# Patient Record
Sex: Female | Born: 1965
Health system: Southern US, Community
[De-identification: ages and names within clinical notes are randomized; demographics above are authoritative.]

## PROBLEM LIST (undated history)

## (undated) DIAGNOSIS — I82409 Acute embolism and thrombosis of unspecified deep veins of unspecified lower extremity: Secondary | ICD-10-CM

## (undated) DIAGNOSIS — I10 Essential (primary) hypertension: Secondary | ICD-10-CM

## (undated) HISTORY — DX: Essential (primary) hypertension: I10

## (undated) HISTORY — PX: TUBAL LIGATION: SHX77

---

## 2014-11-18 DIAGNOSIS — I82409 Acute embolism and thrombosis of unspecified deep veins of unspecified lower extremity: Secondary | ICD-10-CM

## 2014-11-18 HISTORY — DX: Acute embolism and thrombosis of unspecified deep veins of unspecified lower extremity: I82.409

## 2018-04-15 ENCOUNTER — Other Ambulatory Visit: Payer: Self-pay

## 2018-04-15 ENCOUNTER — Encounter (HOSPITAL_COMMUNITY): Payer: Self-pay

## 2018-04-15 ENCOUNTER — Emergency Department (HOSPITAL_COMMUNITY)
Admission: EM | Admit: 2018-04-15 | Discharge: 2018-04-15 | Disposition: A | Discharge status: Home / Self Care | Payer: Self-pay | Attending: Emergency Medicine | Admitting: Emergency Medicine

## 2018-04-15 ENCOUNTER — Emergency Department (HOSPITAL_BASED_OUTPATIENT_CLINIC_OR_DEPARTMENT_OTHER)
Admit: 2018-04-15 | Discharge: 2018-04-15 | Disposition: A | Discharge status: Home / Self Care | Payer: Self-pay | Attending: Emergency Medicine | Admitting: Emergency Medicine

## 2018-04-15 DIAGNOSIS — F17210 Nicotine dependence, cigarettes, uncomplicated: Secondary | ICD-10-CM | POA: Insufficient documentation

## 2018-04-15 DIAGNOSIS — I10 Essential (primary) hypertension: Secondary | ICD-10-CM | POA: Insufficient documentation

## 2018-04-15 DIAGNOSIS — I159 Secondary hypertension, unspecified: Secondary | ICD-10-CM

## 2018-04-15 DIAGNOSIS — M7989 Other specified soft tissue disorders: Secondary | ICD-10-CM

## 2018-04-15 DIAGNOSIS — R609 Edema, unspecified: Secondary | ICD-10-CM | POA: Insufficient documentation

## 2018-04-15 HISTORY — DX: Acute embolism and thrombosis of unspecified deep veins of unspecified lower extremity: I82.409

## 2018-04-15 LAB — COMPREHENSIVE METABOLIC PANEL
ALBUMIN: 3.6 g/dL (ref 3.5–5.0)
ALT: 20 U/L (ref 14–54)
AST: 36 U/L (ref 15–41)
Alkaline Phosphatase: 56 U/L (ref 38–126)
Anion gap: 9 (ref 5–15)
BUN: 13 mg/dL (ref 6–20)
CHLORIDE: 107 mmol/L (ref 101–111)
CO2: 24 mmol/L (ref 22–32)
Calcium: 8.9 mg/dL (ref 8.9–10.3)
Creatinine, Ser: 0.78 mg/dL (ref 0.44–1.00)
GFR calc Af Amer: >60 mL/min (ref >60)
GFR calc non Af Amer: >60 mL/min (ref >60)
GLUCOSE: 97 mg/dL (ref 65–99)
POTASSIUM: 3.6 mmol/L (ref 3.5–5.1)
SODIUM: 140 mmol/L (ref 135–145)
Total Bilirubin: 0.8 mg/dL (ref 0.3–1.2)
Total Protein: 8 g/dL (ref 6.5–8.1)

## 2018-04-15 LAB — CBC WITH DIFFERENTIAL/PLATELET
ABS IMMATURE GRANULOCYTES: 0 10*3/uL (ref 0.0–0.1)
Basophils Absolute: 0 10*3/uL (ref 0.0–0.1)
Basophils Relative: 1 %
EOS PCT: 3 %
Eosinophils Absolute: 0.2 10*3/uL (ref 0.0–0.7)
HEMATOCRIT: 43.3 % (ref 36.0–46.0)
HEMOGLOBIN: 13.7 g/dL (ref 12.0–15.0)
Immature Granulocytes: 0 %
LYMPHS ABS: 2.3 10*3/uL (ref 0.7–4.0)
Lymphocytes Relative: 42 %
MCH: 27.9 pg (ref 26.0–34.0)
MCHC: 31.6 g/dL (ref 30.0–36.0)
MCV: 88.2 fL (ref 78.0–100.0)
MONO ABS: 0.4 10*3/uL (ref 0.1–1.0)
Monocytes Relative: 8 %
NEUTROS ABS: 2.6 10*3/uL (ref 1.7–7.7)
Neutrophils Relative %: 46 %
Platelets: 192 10*3/uL (ref 150–400)
RBC: 4.91 MIL/uL (ref 3.87–5.11)
RDW: 14.4 % (ref 11.5–15.5)
WBC: 5.6 10*3/uL (ref 4.0–10.5)

## 2018-04-15 MED ORDER — HYDRALAZINE HCL 20 MG/ML IJ SOLN: 10.0000 mg | Freq: Once | INTRAMUSCULAR | Status: DC

## 2018-04-15 MED ORDER — HYDRALAZINE HCL 10 MG PO TABS
10.0000 mg | ORAL_TABLET | Freq: Once | ORAL | Status: AC
  Administered 2018-04-15: 10 mg via ORAL
  Filled 2018-04-15: qty 1

## 2018-04-15 MED ORDER — HYDROCHLOROTHIAZIDE 25 MG PO TABS: 25.0000 mg | ORAL_TABLET | Freq: Every day | ORAL | 0 refills | Status: DC

## 2018-04-15 NOTE — ED Notes (Signed)
Called for vitals re-check at 12:37; no response x2 at this time.

## 2018-04-15 NOTE — Discharge Instructions (Signed)
Your blood pressure has been persistently elevated in the ER.  Recommending that you begin medications for hypertension.  Take 25 mg hydrochlorothiazide daily.   You need to follow up with a primary care doctor in 1 week for re-check of blood pressure, there may need to be more medication changes if blood pressure is not well controlled.   Return to the ER for headache, vision changes, vomiting, chest pain, shortness of breath, severe abdominal or back pain, blood in your urine, one sided numbness or weakness.

## 2018-04-15 NOTE — Progress Notes (Signed)
*  Preliminary Results* Left lower extremity venous duplex completed. Left lower extremity is negative for deep vein thrombosis. There is no evidence of left Baker's cyst.  04/15/2018 12:39 PM  Gertie Fey, BS, RVT, RDCS, RDMS

## 2018-04-15 NOTE — ED Notes (Signed)
Pt. Stated, I ve not been call and I think they called me when I went outside and I had to go outside to get some fresh air.  I need to be put back on the list to be seen and Ive developed a toothache

## 2018-04-15 NOTE — ED Notes (Signed)
Patient verbalizes understanding of discharge instructions. Opportunity for questioning and answers were provided. Armband removed by staff, pt discharged from ED. E signature not avaiable.  

## 2018-04-15 NOTE — ED Notes (Signed)
Called name in waiting room no answer x 3.

## 2018-04-15 NOTE — ED Provider Notes (Addendum)
Signs of bilateral leg swelling left greater than right for the past 2 weeks. She denies pain denies shortness of breath. Swelling is worse at the end of the day and improved after elevating her legs. On exam alert no distress HCT exam no facial asymmetry neck supple no bruit heart bradycardic. Pulse counted at 56 bpm by me. Lungs clear auscultation abdomen nondistended nontender left lower extremity with 2+ pretibial pitting edema. Right lower extremities with 1+ pretibial pitting edema. DP pulse 2+ bilaterally.. Patient continues to smoke. She states she's cut from 3 packs per day to a cigarette per day. I counseled patient for 5 minutes on smoking cessation   Doug Sou, MD 04/15/18 1805    Doug Sou, MD 04/15/18 1806 ED ECG REPORT   Date: 04/15/2018  Rate: 55  Rhythm: sinus bradycardia  QRS Axis: normal  Intervals: normal  ST/T Wave abnormalities: normal  Conduction Disutrbances:none  Narrative Interpretation:   Old EKG Reviewed: none available  I have personally reviewed the EKG tracing and agree with the computerized printout as noted.   Doug Sou, MD 04/15/18 Rickey Primus

## 2018-04-15 NOTE — ED Provider Notes (Signed)
[facility] EMERGENCY DEPARTMENT Provider Note   CSN: [id number] Arrival date & time: 04/15/18  6962     History   Chief Complaint Chief Complaint  Patient presents with  . Leg Swelling    HPI Cheryl Mckinney is a 52 y.o. female w/ h/o L DVT here for evaluation of bilateral LE edema x 2-3 weeks, L > R.  Associated with heaviness, dull achy pain and hyperpigmented skin at ankles.  Edema worse at the end of the day, better in the morning with elevation. Has noticed BP has been high for "a long time". Denies fevers, chills, redness, distal paresthesias or numbness. H/o DVT treated with anticoagulation for one year. Denies estrogen use, recent prolonged travel, surgery, malignancy. No CP, SOB, cough, back pain, abdominal pain, headache, vision changes, hematuria.   HPI  Past Medical History:  Diagnosis Date  . DVT (deep venous thrombosis) (HCC) 2016    There are no active problems to display for this patient.   ** The histories are not reviewed yet. Please review them in the "History" navigator section and refresh this SmartLink.   OB History   None      Home Medications    Prior to Admission medications   Medication Sig Start Date End Date Taking? Authorizing Provider  hydrochlorothiazide (HYDRODIURIL) 25 MG tablet Take 1 tablet (25 mg total) by mouth daily. 04/15/18   Liberty Handy, PA-C    Family History No family history on file.  Social History Social History   Tobacco Use  . Smoking status: Current Every Day Smoker    Packs/day: 1.00    Types: Cigarettes  . Smokeless tobacco: Never Used  Substance Use Topics  . Alcohol use: Yes  . Drug use: Yes    Types: Marijuana     Allergies   Patient has no known allergies.   Review of Systems Review of Systems  Cardiovascular: Positive for leg swelling.  All other systems reviewed and are negative.    Physical Exam Updated Vital Signs BP (!) 183/93   Pulse (!) 47   Temp 98 F  (36.7 C) (Oral)   Resp 16   Ht  (1.803 m)   Wt 108 kg (238 lb)   LMP 03/16/2018   SpO2 100%   BMI 33.19 kg/m   Physical Exam  Constitutional: She appears well-developed and well-nourished.  NAD. Non toxic.   HENT:  Head: Normocephalic and atraumatic.  Nose: Nose normal.  Moist mucous membranes. Tonsils and oropharynx normal  Eyes: Conjunctivae, EOM and lids are normal.  Neck: Trachea normal and normal range of motion.  Trachea midline. No cervical adenopathy  Cardiovascular: Normal rate, regular rhythm, S1 normal, S2 normal and normal heart sounds.  Pulses:      Carotid pulses are 2+ on the right side, and 2+ on the left side.      Radial pulses are 2+ on the right side, and 2+ on the left side.       Dorsalis pedis pulses are 2+ on the right side, and 2+ on the left side.  Trace pitting edema to RLE up to knee. 2+ pitting edema to LLE right above knee. Palpable DP/PT pulses bilaterally, slightly diminished in L. No calf tenderness.   Pulmonary/Chest: Effort normal and breath sounds normal. She has no decreased breath sounds. She has no rhonchi.  No rales, crackles. Lungs CTAB.   Abdominal: Soft. Bowel sounds are normal. There is no tenderness.  No epigastric tenderness. NTND  Neurological: She is alert. GCS eye subscore is 4. GCS verbal subscore is 5. GCS motor subscore is 6.  Skin: Skin is warm and dry. Capillary refill takes less than 2 seconds.  Slight hyperpigmentation to medial left ankle. No erythema, warmth, tenderness to LLE.   Psychiatric: She has a normal mood and affect. Her speech is normal and behavior is normal. Judgment and thought content normal. Cognition and memory are normal.     ED Treatments / Results  Labs (all labs ordered are listed, but only abnormal results are displayed) Labs Reviewed  COMPREHENSIVE METABOLIC PANEL  CBC WITH DIFFERENTIAL/PLATELET    EKG None   Date: 04/15/2018  Rate: 55  Rhythm: sinus bradycardia  QRS Axis: normal   Intervals: normal  ST/T Wave abnormalities: normal  Conduction Disutrbances:none  Narrative Interpretation:   Old EKG Reviewed: none available  I have personally reviewed the EKG tracing and agree with the computerized printout as noted.   Doug Sou, MD 04/15/18 1822   Radiology No results found.  Procedures Procedures (including critical care time)  Medications Ordered in ED Medications  hydrALAZINE (APRESOLINE) tablet 10 mg (has no administration in time range)     Initial Impression / Assessment and Plan / ED Course  I have reviewed the triage vital signs and the nursing notes.  Pertinent labs & imaging results that were available during my care of the patient were reviewed by me and considered in my medical decision making (see chart for details).    Venous stasis high on differential. No DVT on [place]. No signs of cellulitis. She denies symptoms of hypertensive crisis end organ dysfunction. She has no CP, SOB, tachycardia, tachypnea to suggest PE. Creatinine WNL. EKG shows sinus bradycardia, asymptomatic.  SBP has improved in ER. Will give oral antihypertensive and discharge home with HCTZ. Stressed importance of establish care with PCP and re-evaluation in 1 week.  [facility] information. Counseled on tobacco cessation for >5 min. Pt shared with SP.   Final Clinical Impressions(s) / ED Diagnoses   Final diagnoses:  Peripheral edema  Secondary hypertension    ED Discharge Orders        Ordered    hydrochlorothiazide (HYDRODIURIL) 25 MG tablet  Daily     04/15/18 1909       Liberty Handy, PA-C 04/15/18 1916    Doug Sou, MD 04/15/18 [phone]

## 2018-04-15 NOTE — ED Notes (Signed)
Results reviewed, no changes in acuity at this time 

## 2018-04-15 NOTE — ED Triage Notes (Signed)
Pt states that she was diagnosed with a blood clot in her left leg in 2016 and was on blood thinners for a year. Pt states she is no longer on blood thinners. Pt reports that she noticed her leg swelling again two weeks ago. Denies CP, Denies SOB. Pt has palpable pulse in left foot with 2+ edema in left ankle.

## 2018-05-18 ENCOUNTER — Other Ambulatory Visit: Payer: Self-pay

## 2018-05-18 ENCOUNTER — Emergency Department (HOSPITAL_COMMUNITY): Payer: Self-pay

## 2018-05-18 ENCOUNTER — Encounter (HOSPITAL_COMMUNITY): Payer: Self-pay | Admitting: Emergency Medicine

## 2018-05-18 ENCOUNTER — Emergency Department (HOSPITAL_COMMUNITY)
Admission: EM | Admit: 2018-05-18 | Discharge: 2018-05-18 | Disposition: A | Discharge status: Home / Self Care | Payer: Self-pay | Attending: Emergency Medicine | Admitting: Emergency Medicine

## 2018-05-18 DIAGNOSIS — F17210 Nicotine dependence, cigarettes, uncomplicated: Secondary | ICD-10-CM | POA: Insufficient documentation

## 2018-05-18 DIAGNOSIS — L88 Pyoderma gangrenosum: Secondary | ICD-10-CM | POA: Insufficient documentation

## 2018-05-18 DIAGNOSIS — Z79899 Other long term (current) drug therapy: Secondary | ICD-10-CM | POA: Insufficient documentation

## 2018-05-18 LAB — CBC
HEMATOCRIT: 44.5 % (ref 36.0–46.0)
Hemoglobin: 13.9 g/dL (ref 12.0–15.0)
MCH: 27.7 pg (ref 26.0–34.0)
MCHC: 31.2 g/dL (ref 30.0–36.0)
MCV: 88.8 fL (ref 78.0–100.0)
Platelets: 198 10*3/uL (ref 150–400)
RBC: 5.01 MIL/uL (ref 3.87–5.11)
RDW: 13.9 % (ref 11.5–15.5)
WBC: 5.7 10*3/uL (ref 4.0–10.5)

## 2018-05-18 LAB — COMPREHENSIVE METABOLIC PANEL
ALT: 15 U/L (ref 0–44)
ANION GAP: 8 (ref 5–15)
AST: 19 U/L (ref 15–41)
Albumin: 3.3 g/dL — ABNORMAL LOW (ref 3.5–5.0)
Alkaline Phosphatase: 62 U/L (ref 38–126)
BILIRUBIN TOTAL: 0.4 mg/dL (ref 0.3–1.2)
BUN: 16 mg/dL (ref 6–20)
CO2: 30 mmol/L (ref 22–32)
Calcium: 8.9 mg/dL (ref 8.9–10.3)
Chloride: 104 mmol/L (ref 98–111)
Creatinine, Ser: 0.75 mg/dL (ref 0.44–1.00)
GFR calc Af Amer: >60 mL/min (ref >60)
GFR calc non Af Amer: >60 mL/min (ref >60)
Glucose, Bld: 95 mg/dL (ref 70–99)
POTASSIUM: 2.9 mmol/L — AB (ref 3.5–5.1)
Sodium: 142 mmol/L (ref 135–145)
TOTAL PROTEIN: 8.3 g/dL — AB (ref 6.5–8.1)

## 2018-05-18 LAB — SEDIMENTATION RATE: SED RATE: 30 mm/h — AB (ref 0–22)

## 2018-05-18 LAB — I-STAT CG4 LACTIC ACID, ED: Lactic Acid, Venous: 0.47 mmol/L — ABNORMAL LOW (ref 0.5–1.9)

## 2018-05-18 MED ORDER — ZINC OXIDE 12.8 % EX OINT: 1.0000 "application " | TOPICAL_OINTMENT | CUTANEOUS | 0 refills | Status: DC | PRN

## 2018-05-18 MED ORDER — OXYCODONE-ACETAMINOPHEN 5-325 MG PO TABS: 1.0000 | ORAL_TABLET | Freq: Three times a day (TID) | ORAL | 0 refills | Status: DC | PRN

## 2018-05-18 MED ORDER — OXYCODONE-ACETAMINOPHEN 5-325 MG PO TABS
1.0000 | ORAL_TABLET | Freq: Once | ORAL | Status: AC
  Administered 2018-05-18: 1 via ORAL
  Filled 2018-05-18: qty 1

## 2018-05-18 MED ORDER — POTASSIUM CHLORIDE CRYS ER 20 MEQ PO TBCR
40.0000 meq | EXTENDED_RELEASE_TABLET | Freq: Once | ORAL | Status: AC
  Administered 2018-05-18: 40 meq via ORAL
  Filled 2018-05-18: qty 2

## 2018-05-18 NOTE — ED Triage Notes (Signed)
Pt. Stated, I got bit I guess by a spider on my left leg. (lateral left) . Skin is peeling back. Its been almost a month. Area looks infected and necrosed

## 2018-05-18 NOTE — ED Notes (Signed)
Discharge instructions and prescriptions discussed with Pt. Pt verbalized understanding. Pt stable and ambulatory.   

## 2018-05-18 NOTE — ED Notes (Signed)
Patient transported to X-ray 

## 2018-05-18 NOTE — Discharge Instructions (Addendum)
[facility] on July 30 at 9:10 AM.  You can also go to [facility].  At [facility] location, they have a pharmacy and also a finance assistance center.  To care for your wound at home, apply petroleum or zinc oxide ointment to help prevent further skin breakdown at the edge of the wound.  You can clean the wound with warm water and soap, apply a topical antibiotic such as bacitracin or Neosporin, and then place a gauze dressing patient symptoms that does not stick to the skin to the wound.  You should follow-up with dermatology.  One clinic that is affordable is here: [facility] Address  [address] [place], [place] [address]  Appointments [phone]   Return to the emergency department if you develop fever, chills, red streaking up the leg, or if the area starts to have  thick, mucus-like drainage.

## 2018-05-18 NOTE — ED Provider Notes (Signed)
[facility]ENC[facility]PART[facility] Provider Note   CSN: [id number] Arrival date & time: 05/18/18  [id number]     History   Chief Complaint Chief Complaint  Patient presents with  . Insect Bite  . Leg Pain    HPI Cheryl Mckinney is a 52 y.o. female with a h/o of DVT who present to the Emergency Department with a chief complaint of left lower leg lesion.  Patient reports she notices some itching to the left lower extremity about 3 weeks ago followed by development of a large, clear-fluid filled "blister to the leg the following day. Over the next few days, the ulcer fluid changed from clear to yellow. She reports several days later she got in the bathtub and noticed yellow discoloration to the word. A few days later, she reports that her skin began to peel off around the wound.  She has 10/10 sharp pain to the left lower leg. No fever or chills. No know chemical exposures. No numbness, weakness, erythema or warmth. She has been taking Tylenol and ibuprofen with no improvement in her symptoms. She does not have insurance and is not established with a PCP.  The history is provided by the patient. No language interpreter was used.    Past Medical History:  Diagnosis Date  . DVT (deep venous thrombosis) (HCC) 2016    There are no active problems to display for this patient.   History reviewed. No pertinent surgical history.   OB History   None      Home Medications    Prior to Admission medications   Medication Sig Start Date End Date Taking? Authorizing Provider  hydrochlorothiazide (HYDRODIURIL) 25 MG tablet Take 1 tablet (25 mg total) by mouth daily. 04/15/18   Liberty HandyGibbons, Claudia J, PA-C  oxyCODONE-acetaminophen (PERCOCET/ROXICET) 5-325 MG tablet Take 1 tablet by mouth every 8 (eight) hours as needed for severe pain. 05/18/18   Omkar Stratmann A, PA-C  Zinc Oxide (TRIPLE PASTE) 12.8 % ointment Apply 1 application topically as needed for irritation. 05/18/18   Mersadez Linden  A, PA-C    Family History No family history on file.  Social History Social History   Tobacco Use  . Smoking status: Current Every Day Smoker    Packs/day: 1.00    Types: Cigarettes  . Smokeless tobacco: Never Used  Substance Use Topics  . Alcohol use: Yes  . Drug use: Yes    Types: Marijuana     Allergies   Patient has no known allergies.   Review of Systems Review of Systems  Constitutional: Negative for activity change, chills and fever.  Respiratory: Negative for shortness of breath.   Cardiovascular: Negative for chest pain.  Gastrointestinal: Negative for abdominal pain.  Genitourinary: Negative for dysuria.  Musculoskeletal: Positive for arthralgias and myalgias. Negative for back pain, gait problem and joint swelling.  Skin: Positive for wound. Negative for color change and rash.  Allergic/Immunologic: Negative for immunocompromised state.  Neurological: Negative for headaches.  Psychiatric/Behavioral: Negative for confusion.     Physical Exam Updated Vital Signs BP 132/86 (BP Location: Right Arm)   Pulse 62   Temp 98.8 F (37.1 C) (Oral)   Resp 12   Ht 5\' 11"  (1.803 m)   Wt 108 kg (238 lb)   LMP 02/16/2018   SpO2 99%   BMI 33.19 kg/m   Physical Exam  Constitutional: No distress.  HENT:  Head: Normocephalic.  Eyes: Conjunctivae are normal.  Neck: Neck supple.  Cardiovascular: Normal rate and  regular rhythm. Exam reveals no gallop and no friction rub.  No murmur heard. Pulmonary/Chest: Effort normal. No respiratory distress.  Abdominal: Soft. She exhibits no distension.  Musculoskeletal: She exhibits tenderness. She exhibits no deformity.  Large ulcerated lesion to the lower leg. LLL is tender to palpation. Ambulatory without difficulty. See picture below.   Neurological: She is alert.  Skin: Skin is warm. No rash noted.  Psychiatric: Her behavior is normal.  Nursing note and vitals reviewed.        ED Treatments / Results   Labs (all labs ordered are listed, but only abnormal results are displayed) Labs Reviewed  COMPREHENSIVE METABOLIC PANEL - Abnormal; Notable for the following components:      Result Value   Potassium 2.9 (*)    Total Protein 8.3 (*)    Albumin 3.3 (*)    All other components within normal limits  SEDIMENTATION RATE - Abnormal; Notable for the following components:   Sed Rate 30 (*)    All other components within normal limits  I-STAT CG4 LACTIC ACID, ED - Abnormal; Notable for the following components:   Lactic Acid, Venous 0.47 (*)    All other components within normal limits  CBC    EKG None  Radiology Dg Chest 2 View  Result Date: 05/18/2018 CLINICAL DATA:  Suspected pyoderma gangrenosum. Assess for pulmonary involvement EXAM: CHEST - 2 VIEW COMPARISON:  None. FINDINGS: Linear densities at the right lung base likely reflect scarring or atelectasis. No visible nodules. Heart is normal size. No effusions or acute bony abnormality. IMPRESSION: No visible pulmonary nodules by plain film. Linear densities at the right lung base likely reflects scarring or atelectasis. Electronically Signed   By: Charlett Nose M.D.   On: 05/18/2018 14:07   Dg Tibia/fibula Left  Result Date: 05/18/2018 CLINICAL DATA:  Left leg pain after spider bite. EXAM: LEFT TIBIA AND FIBULA - 2 VIEW COMPARISON:  None. FINDINGS: There is no evidence of fracture or other focal bone lesions. Soft tissue defect is seen laterally in the lower left leg concerning for laceration or other soft tissue injury. No lytic destruction is seen to suggest osteomyelitis. No foreign body is noted. IMPRESSION: Normal left tibia and fibula. Soft tissue defect seen laterally in left lower leg concerning for laceration or other soft tissue injury. Electronically Signed   By: Lupita Raider, M.D.   On: 05/18/2018 12:24    Procedures Procedures (including critical care time)  Medications Ordered in ED Medications   oxyCODONE-acetaminophen (PERCOCET/ROXICET) 5-325 MG per tablet 1 tablet (1 tablet Oral Given 05/18/18 1247)  potassium chloride SA (K-DUR,KLOR-CON) CR tablet 40 mEq (40 mEq Oral Given 05/18/18 1511)     Initial Impression / Assessment and Plan / ED Course  I have reviewed the triage vital signs and the nursing notes.  Pertinent labs & imaging results that were available during my care of the patient were reviewed by me and considered in my medical decision making (see chart for details).     52 year old female ith a h/o of DVT who present to the Emergency Department with a chief complaint of left lower leg lesion for 3 weeks. No constitutional symptoms. Lesion looks consistent with a pyoderma gangrenosa. No signs of infection. She is hypokalemic, replenished in the ED. Labs are otherwise reassuring. CXR is clear. Wound care provided in the ED. Recommended barrier cream to help with skin breakdown. Referral to dermatology has has been given. Case management consulted as the patient does not  have medical insurance at this time. Strict return precautions given. NAD. She is safe for d/c to home with outpatient f/u at this time.   Final Clinical Impressions(s) / ED Diagnoses   Final diagnoses:  Pyoderma gangrenosa    ED Discharge Orders        Ordered    Zinc Oxide (TRIPLE PASTE) 12.8 % ointment  As needed     05/18/18 1532    oxyCODONE-acetaminophen (PERCOCET/ROXICET) 5-325 MG tablet  Every 8 hours PRN     05/18/18 1532       Jasenia Weilbacher A, PA-C 05/19/18 0011    Blane Ohara, MD 05/19/18 1133    Blane Ohara, MD 05/19/18 1149

## 2018-05-19 NOTE — Discharge Planning (Signed)
Late Entry:  EDCM [facility] and placed information on AVS.

## 2018-05-26 ENCOUNTER — Emergency Department (HOSPITAL_COMMUNITY)
Admission: EM | Admit: 2018-05-26 | Discharge: 2018-05-26 | Disposition: A | Discharge status: Home / Self Care | Payer: Self-pay | Attending: Emergency Medicine | Admitting: Emergency Medicine

## 2018-05-26 ENCOUNTER — Encounter (HOSPITAL_COMMUNITY): Payer: Self-pay | Admitting: Emergency Medicine

## 2018-05-26 ENCOUNTER — Other Ambulatory Visit: Payer: Self-pay

## 2018-05-26 DIAGNOSIS — T148XXA Other injury of unspecified body region, initial encounter: Secondary | ICD-10-CM

## 2018-05-26 DIAGNOSIS — M79662 Pain in left lower leg: Secondary | ICD-10-CM | POA: Insufficient documentation

## 2018-05-26 DIAGNOSIS — Z79899 Other long term (current) drug therapy: Secondary | ICD-10-CM | POA: Insufficient documentation

## 2018-05-26 DIAGNOSIS — L089 Local infection of the skin and subcutaneous tissue, unspecified: Secondary | ICD-10-CM | POA: Insufficient documentation

## 2018-05-26 DIAGNOSIS — F17210 Nicotine dependence, cigarettes, uncomplicated: Secondary | ICD-10-CM | POA: Insufficient documentation

## 2018-05-26 MED ORDER — CLINDAMYCIN HCL 150 MG PO CAPS
300.0000 mg | ORAL_CAPSULE | Freq: Once | ORAL | Status: AC
  Administered 2018-05-26: 300 mg via ORAL
  Filled 2018-05-26: qty 2

## 2018-05-26 MED ORDER — CLINDAMYCIN HCL 300 MG PO CAPS: 300.0000 mg | ORAL_CAPSULE | Freq: Four times a day (QID) | ORAL | 0 refills | Status: AC

## 2018-05-26 NOTE — ED Notes (Signed)
Patient verbalizes understanding of discharge instructions. Opportunity for questioning and answers were provided. Armband removed by staff, pt discharged from ED ambulatory.   

## 2018-05-26 NOTE — ED Triage Notes (Signed)
Pt. Stated, I was here on July 4th for the same. I have pain meds. She said it was not infected and now it stinks of odor so I know its infected.  Left lateral lower leg. Exudate, pus, skin necrosis all around the wound

## 2018-05-26 NOTE — Discharge Instructions (Signed)
Clean wound with mild soap such as Dove. Apply the triple antibiotic ointment previously recommended.  DO NOT USE PEROXIDE on the wound. Take Clindamycin as prescribed and complete the full course. Follow up with the appointment you have, return to the ER for worsening or concerning symptoms.

## 2018-05-26 NOTE — ED Notes (Signed)
Pt's name called for triage x3.  No response.

## 2018-05-26 NOTE — ED Provider Notes (Signed)
[facility] EMERGENCY DEPARTMENT Provider Note   CSN: [id number] Arrival date & time: 05/26/18  0913     History   Chief Complaint Chief Complaint  Patient presents with  . Wound Infection    HPI Cheryl Mckinney is a 52 y.o. female.  52 year old female presents with ongoing wound to the left lower leg laterally.  Patient states that she was seen in the emergency room at the end of May for pain in her left lower leg, concerned that she may have another DVT.  Doppler study done at that time was negative for DVT.  Patient states that a few days later she noticed a small bump pop up on her left lower leg which she squeezed and states that when it opened there were 2 small dots in the area that she thought may be a spider bite.  Patient states that she has good cold her symptoms and believes she was bitten by a hobo spider.  Patient states the area right developed into a larger wound to her left lower leg which she has been cleaning with peroxide and applying Neosporin.  Patient was seen here on July 1 for the same, advised to apply triple antibiotic ointment to the area, referred to dermatology at [facility] for follow-up and is scheduled to be seen at the end of the month.  Patient states she continues to have yellow cloudy drainage from the wound and she is concerned it is infected, upset she was not given oral antibiotics at her last visit.  Denies history of diabetes, takes HCTZ for high blood pressure and leg swelling.  No other complaints or concerns.     Past Medical History:  Diagnosis Date  . DVT (deep venous thrombosis) (HCC) 2016    There are no active problems to display for this patient.   History reviewed. No pertinent surgical history.   OB History   None      Home Medications    Prior to Admission medications   Medication Sig Start Date End Date Taking? Authorizing Provider  clindamycin (CLEOCIN) 300 MG capsule Take 1 capsule (300 mg total) by mouth  every 6 (six) hours for 10 days. 05/26/18 06/05/18  Jeannie Fend, PA-C  hydrochlorothiazide (HYDRODIURIL) 25 MG tablet Take 1 tablet (25 mg total) by mouth daily. 04/15/18   Liberty Handy, PA-C  oxyCODONE-acetaminophen (PERCOCET/ROXICET) 5-325 MG tablet Take 1 tablet by mouth every 8 (eight) hours as needed for severe pain. 05/18/18   McDonald, Mia A, PA-C  Zinc Oxide (TRIPLE PASTE) 12.8 % ointment Apply 1 application topically as needed for irritation. 05/18/18   McDonald, Mia A, PA-C    Family History No family history on file.  Social History Social History   Tobacco Use  . Smoking status: Current Every Day Smoker    Packs/day: 1.00    Types: Cigarettes  . Smokeless tobacco: Never Used  Substance Use Topics  . Alcohol use: Yes  . Drug use: Yes    Types: Marijuana     Allergies   Patient has no known allergies.   Review of Systems Review of Systems  Constitutional: Negative for chills and fever.  Gastrointestinal: Negative for vomiting.  Musculoskeletal: Negative for arthralgias and myalgias.  Skin: Positive for wound.  Allergic/Immunologic: Negative for immunocompromised state.  Hematological: Negative for adenopathy. Does not bruise/bleed easily.  Psychiatric/Behavioral: Negative for confusion.  All other systems reviewed and are negative.    Physical Exam Updated Vital Signs BP (!) 141/97  Pulse 70   Temp 98.3 F (36.8 C) (Oral)   Resp 18   Ht 5\' 11"  (1.803 m)   Wt 108 kg (238 lb)   LMP 03/17/2018   SpO2 100%   BMI 33.19 kg/m   Physical Exam  Constitutional: She is oriented to person, place, and time. She appears well-developed and well-nourished. No distress.  HENT:  Head: Normocephalic and atraumatic.  Pulmonary/Chest: Effort normal.  Musculoskeletal: She exhibits no deformity.  Neurological: She is alert and oriented to person, place, and time.  Skin: Skin is warm and dry. Capillary refill takes less than 2 seconds. She is not diaphoretic. No  erythema.     Psychiatric: She has a normal mood and affect. Her behavior is normal.  Nursing note and vitals reviewed.    ED Treatments / Results  Labs (all labs ordered are listed, but only abnormal results are displayed) Labs Reviewed - No data to display  EKG None  Radiology No results found.  Procedures Procedures (including critical care time)  Medications Ordered in ED Medications  clindamycin (CLEOCIN) capsule 300 mg (300 mg Oral Given 05/26/18 1341)     Initial Impression / Assessment and Plan / ED Course  I have reviewed the triage vital signs and the nursing notes.  Pertinent labs & imaging results that were available during my care of the patient were reviewed by me and considered in my medical decision making (see chart for details).  Clinical Course as of May 27 1347  Tue May 26, 2018  1343 52yo female here for wound recheck, seen 8 days ago for same, referred to dermatology but concerned she needs an antibiotic for her leg wound/infection due to drainage described as yellow/milky/with odor. On exam, wound appears to be slowly improving compared to previous image, wound does have a small amount of purulent drainage which has been cultured. Advised him to stop using peroxide to clean her wound.  Recommend mild soap such as Dove, she can apply the antibiotic ointment previously prescribed, take clindamycin as prescribed and follow-up with dermatology as previously scheduled.  Image updated for patient's records today, discussed with Dr. Erma HeritageIsaacs who agrees with plan of care, differential considered includes pyoderma.   [LM]    Clinical Course User Index [LM] Jeannie FendMurphy, Kynzley Dowson A, PA-C   Final Clinical Impressions(s) / ED Diagnoses   Final diagnoses:  Wound infection    ED Discharge Orders        Ordered    clindamycin (CLEOCIN) 300 MG capsule  Every 6 hours     05/26/18 1323       Jeannie FendMurphy, Sharmin Foulk A, PA-C 05/26/18 1348    Shaune PollackIsaacs, Cameron, MD 05/27/18  [phone]

## 2018-06-16 ENCOUNTER — Ambulatory Visit (INDEPENDENT_AMBULATORY_CARE_PROVIDER_SITE_OTHER): Payer: Self-pay | Admitting: Physician Assistant

## 2018-07-17 ENCOUNTER — Ambulatory Visit: Payer: Self-pay | Attending: Nurse Practitioner | Admitting: Nurse Practitioner

## 2018-07-17 ENCOUNTER — Ambulatory Visit: Payer: Self-pay | Admitting: Family Medicine

## 2018-07-17 ENCOUNTER — Encounter: Payer: Self-pay | Admitting: Nurse Practitioner

## 2018-07-17 VITALS — BP 177/122 | HR 63 | Temp 99.1°F | Ht 71.5 in | Wt 219.2 lb

## 2018-07-17 DIAGNOSIS — Z86718 Personal history of other venous thrombosis and embolism: Secondary | ICD-10-CM | POA: Insufficient documentation

## 2018-07-17 DIAGNOSIS — Z8249 Family history of ischemic heart disease and other diseases of the circulatory system: Secondary | ICD-10-CM | POA: Insufficient documentation

## 2018-07-17 DIAGNOSIS — F17200 Nicotine dependence, unspecified, uncomplicated: Secondary | ICD-10-CM | POA: Insufficient documentation

## 2018-07-17 DIAGNOSIS — L080 Pyoderma: Secondary | ICD-10-CM | POA: Insufficient documentation

## 2018-07-17 DIAGNOSIS — Z79899 Other long term (current) drug therapy: Secondary | ICD-10-CM | POA: Insufficient documentation

## 2018-07-17 DIAGNOSIS — I10 Essential (primary) hypertension: Secondary | ICD-10-CM | POA: Insufficient documentation

## 2018-07-17 DIAGNOSIS — Z833 Family history of diabetes mellitus: Secondary | ICD-10-CM | POA: Insufficient documentation

## 2018-07-17 DIAGNOSIS — L88 Pyoderma gangrenosum: Secondary | ICD-10-CM

## 2018-07-17 MED ORDER — ZINC OXIDE 12.8 % EX OINT: 1.0000 "application " | TOPICAL_OINTMENT | CUTANEOUS | 1 refills | Status: DC | PRN

## 2018-07-17 MED ORDER — PREDNISONE 20 MG PO TABS: 60.0000 mg | ORAL_TABLET | Freq: Every day | ORAL | 1 refills | Status: AC

## 2018-07-17 MED ORDER — CLONIDINE HCL 0.1 MG PO TABS
0.2000 mg | ORAL_TABLET | Freq: Once | ORAL | Status: AC
Start: 2018-07-17 — End: 2018-07-17
  Administered 2018-07-17: 0.2 mg via ORAL

## 2018-07-17 MED ORDER — HYDROCHLOROTHIAZIDE 25 MG PO TABS: 25.0000 mg | ORAL_TABLET | Freq: Every day | ORAL | 0 refills | Status: DC

## 2018-07-17 MED ORDER — TRAMADOL HCL 50 MG PO TABS: 50.0000 mg | ORAL_TABLET | Freq: Four times a day (QID) | ORAL | 0 refills | Status: DC | PRN

## 2018-07-17 MED ORDER — CLINDAMYCIN HCL 300 MG PO CAPS: 300.0000 mg | ORAL_CAPSULE | Freq: Four times a day (QID) | ORAL | 0 refills | Status: AC

## 2018-07-17 MED ORDER — PREDNISONE 20 MG PO TABS: 60.0000 mg | ORAL_TABLET | Freq: Every day | ORAL | 1 refills | Status: DC

## 2018-07-17 MED FILL — predniSONE 20 MG TABS: 20 | 30 days supply | Qty: 90 | Fill #0

## 2018-07-17 MED FILL — CLINDAMYCIN HCL 300 MG CAP: 300 | 10 days supply | Qty: 40 | Fill #0

## 2018-07-17 NOTE — Progress Notes (Signed)
Assessment & Plan:  Cheryl Mckinney was seen today for new patient (initial visit) and wound check.  Diagnoses and all orders for this visit:  Pyoderma gangrenosa STOP SMOKING. This only worsens circulation.  -     clindamycin (CLEOCIN) 300 MG capsule; Take 1 capsule (300 mg total) by mouth 4 (four) times daily for 10 days. -     predniSONE (DELTASONE) 20 MG tablet; Take 3 tablets (60 mg total) by mouth daily with breakfast. -     Zinc Oxide (TRIPLE PASTE) 12.8 % ointment; Apply 1 application topically as needed for irritation. -     traMADol (ULTRAM) 50 MG tablet; Take 1 tablet (50 mg total) by mouth every 6 (six) hours as needed.  Essential hypertension -     cloNIDine (CATAPRES) tablet 0.2 mg -     hydrochlorothiazide (HYDRODIURIL) 25 MG tablet; Take 1 tablet (25 mg total) by mouth daily. -     CMP14+EGFR Continue all antihypertensives as prescribed.  Remember to bring in your blood pressure log with you for your follow up appointment.  DASH/Mediterranean Diets are healthier choices for HTN.      Patient has been counseled on age-appropriate routine health concerns for screening and prevention. These are reviewed and up-to-date. Referrals have been placed accordingly. Immunizations are up-to-date or declined.    Subjective:   Chief Complaint  Patient presents with  . New Patient (Initial Visit)    Pt. is here for hypertension. Pt. stated she only had her BP meds for one month.   . Wound Check    Pt. have a left leg wound that have not been heal. Pt. stated she used to be on Warfarin and coumadin shots before.    HPI Cheryl Mckinney 52 y.o. female presents to office today for hospital follow up.     Skin Lesion She has a history of left lower leg wound which she has been seen in the ED for twice since May 18, 2018. She believed she had been bitten by a spider.  There was concern for pyoderma gangrenosa and patient was referred to Dermatology/[facility]. She states  today she will not be able to follow up due to lack of insurance. She was given clindamycin and states the wound improved slightly however once she completed the antibiotics the wound started draining yellowish fluid and the affected area became larger with swelling and tenderness. She ruled out recently for DVT. She does endorse a PMH of LLE DVT. Today I will start her on prednisone, clindamycin and give her an antibiotic ointment to place around the lesion. She has been instructed to follow up to establish care with her PCP and Patient was urged to apply for the financial assistance program.  She was instructed to inquire at the front desk about the application process for the [place] discount, orange card or other financial assistance.     CHRONIC HYPERTENSION Disease Monitoring  Blood pressure range BP Readings from Last 3 Encounters:  07/17/18 (!) 177/122  05/26/18 (!) 141/97  05/18/18 132/86   Chest pain: no   Dyspnea: no   Claudication: no  Medication compliance: no  Medication Side Effects  Lightheadedness: no   Urinary frequency: no   Edema: yes   Impotence: no  Preventitive Healthcare:  Exercise: no   Diet Pattern: diet: general  Salt Restriction:  no   Review of Systems  Constitutional: Negative for fever, malaise/fatigue and weight loss.  HENT: Negative.  Negative for nosebleeds.  Eyes: Negative.  Negative for blurred vision, double vision and photophobia.  Respiratory: Negative.  Negative for cough and shortness of breath.   Cardiovascular: Negative.  Negative for chest pain, palpitations and leg swelling.  Gastrointestinal: Negative.  Negative for heartburn, nausea and vomiting.  Musculoskeletal: Negative.  Negative for myalgias.  Skin:       SEE HPI  Neurological: Negative.  Negative for dizziness, focal weakness, seizures and headaches.  Psychiatric/Behavioral: Negative.  Negative for suicidal ideas.    Past Medical History:  Diagnosis Date  . DVT (deep  venous thrombosis) ([place]) 2016  . Hypertension     Past Surgical History:  Procedure Laterality Date  . CESAREAN SECTION     x5  . TUBAL LIGATION      Family History  Problem Relation Age of Onset  . Hypertension Mother   . Diabetes Mother     Social History Reviewed with no changes to be made today.   Outpatient Medications Prior to Visit  Medication Sig Dispense Refill  . hydrochlorothiazide (HYDRODIURIL) 25 MG tablet Take 1 tablet (25 mg total) by mouth daily. (Patient not taking: Reported on 07/17/2018) 30 tablet 0  . oxyCODONE-acetaminophen (PERCOCET/ROXICET) 5-325 MG tablet Take 1 tablet by mouth every 8 (eight) hours as needed for severe pain. (Patient not taking: Reported on 07/17/2018) 8 tablet 0  . Zinc Oxide (TRIPLE PASTE) 12.8 % ointment Apply 1 application topically as needed for irritation. (Patient not taking: Reported on 07/17/2018) 56.7 g 0   No facility-administered medications prior to visit.     No Known Allergies     Objective:    BP (!) 177/122 (BP Location: Right Arm, Patient Position: Sitting, Cuff Size: Large)   Pulse 63   Temp 99.1 F (37.3 C) (Oral)   Ht 5' 11.5" (1.816 m)   Wt 219 lb 3.2 oz (99.4 kg)   LMP  (LMP Unknown)   SpO2 93%   BMI 30.15 kg/m  Wt Readings from Last 3 Encounters:  07/17/18 219 lb 3.2 oz (99.4 kg)  05/26/18 238 lb (108 kg)  05/18/18 238 lb (108 kg)    Physical Exam  Constitutional: She is oriented to person, place, and time. She appears well-developed and well-nourished. She is cooperative.  HENT:  Head: Normocephalic and atraumatic.  Eyes: EOM are normal.  Neck: Normal range of motion.  Cardiovascular: Normal rate, regular rhythm, normal heart sounds and intact distal pulses. Exam reveals no gallop and no friction rub.  No murmur heard. Pulmonary/Chest: Effort normal and breath sounds normal. No tachypnea. No respiratory distress. She has no decreased breath sounds. She has no wheezes. She has no rhonchi. She has  no rales. She exhibits no tenderness.  Abdominal: Soft. Bowel sounds are normal.  Musculoskeletal: Normal range of motion. She exhibits no edema.       Left lower leg: She exhibits tenderness and swelling.  Neurological: She is alert and oriented to person, place, and time. Coordination normal.  Skin: Skin is warm and dry. Lesion (See photo below) noted.  Psychiatric: She has a normal mood and affect. Her behavior is normal. Judgment and thought content normal.  Nursing note and vitals reviewed.       Patient has been counseled extensively about nutrition and exercise as well as the importance of adherence with medications and regular follow-up. The patient was given clear instructions to go to ER or return to medical center if symptoms don't improve, worsen or new problems develop. The patient verbalized understanding.  Follow-up: Return in about 10 days (around 07/27/2018) for Have her follow up with DR. FULP (NEW PCP) as initially  scheduled. Gildardo Pounds, FNP-BC [facility], [place]   07/21/2018, 5:23 PM

## 2018-07-18 LAB — CMP14+EGFR
ALK PHOS: 65 IU/L (ref 39–117)
ALT: 8 IU/L (ref 0–32)
AST: 14 IU/L (ref 0–40)
Albumin/Globulin Ratio: 1.1 — ABNORMAL LOW (ref 1.2–2.2)
Albumin: 4 g/dL (ref 3.5–5.5)
BUN/Creatinine Ratio: 12 (ref 9–23)
BUN: 13 mg/dL (ref 6–24)
Bilirubin Total: 0.3 mg/dL (ref 0.0–1.2)
CO2: 21 mmol/L (ref 20–29)
CREATININE: 1.12 mg/dL — AB (ref 0.57–1.00)
Calcium: 9 mg/dL (ref 8.7–10.2)
Chloride: 106 mmol/L (ref 96–106)
GFR calc Af Amer: 66 mL/min/{1.73_m2} (ref >59)
GFR calc non Af Amer: 57 mL/min/{1.73_m2} — ABNORMAL LOW (ref >59)
GLUCOSE: 85 mg/dL (ref 65–99)
Globulin, Total: 3.6 g/dL (ref 1.5–4.5)
Potassium: 3.6 mmol/L (ref 3.5–5.2)
Sodium: 141 mmol/L (ref 134–144)
Total Protein: 7.6 g/dL (ref 6.0–8.5)

## 2018-07-21 ENCOUNTER — Encounter: Payer: Self-pay | Admitting: Nurse Practitioner

## 2018-07-22 ENCOUNTER — Telehealth: Payer: Self-pay | Admitting: Nurse Practitioner

## 2018-07-22 ENCOUNTER — Telehealth: Payer: Self-pay

## 2018-07-22 NOTE — Telephone Encounter (Signed)
CMA attempt to call patient to inform on results.  No answer and left a VM for patient to call back, if patient call back, please inform:  Creatinine or kidney level is slightly elevated. Will continue to monitor for now. Your blood pressure medication may need to be changed based on future lab results. Will need to have your labs rechecked at your next office visit on 08-04-2018  A letter will be send out to reach patient.

## 2018-07-22 NOTE — Telephone Encounter (Signed)
Patient called back regarding a missed call regarding lab results. Please follow up with patient.

## 2018-07-22 NOTE — Telephone Encounter (Signed)
-----   Message from Claiborne Rigg, NP sent at 07/21/2018  5:37 PM EDT ----- Creatinine or kidney level is slightly elevated. Will continue to monitor for now. Your blood pressure medication may need to be changed based on future lab results. Will need to have your labs rechecked at your next office visit on 08-04-2018

## 2018-07-23 NOTE — Telephone Encounter (Signed)
CMA attempt to call patient to inform her lab results.  No answer and left a VM for a call back.

## 2018-07-29 NOTE — Telephone Encounter (Signed)
CMA attempt to call patient back to inform on results.  No answer and left a Vm.

## 2018-07-29 NOTE — Telephone Encounter (Signed)
CMA spoke to patient and inform on lab results. Patient verified DOB. Patient understood.

## 2018-07-29 NOTE — Telephone Encounter (Signed)
Patient called requesting lab results, please follow up °

## 2018-08-04 ENCOUNTER — Encounter: Payer: Self-pay | Admitting: Family Medicine

## 2018-08-04 ENCOUNTER — Other Ambulatory Visit: Payer: Self-pay

## 2018-08-04 ENCOUNTER — Ambulatory Visit: Payer: Self-pay | Attending: Family Medicine | Admitting: Family Medicine

## 2018-08-04 VITALS — BP 160/92 | HR 88 | Temp 99.2°F | Resp 18 | Ht 71.0 in | Wt 212.0 lb

## 2018-08-04 DIAGNOSIS — T148XXA Other injury of unspecified body region, initial encounter: Secondary | ICD-10-CM

## 2018-08-04 DIAGNOSIS — F17210 Nicotine dependence, cigarettes, uncomplicated: Secondary | ICD-10-CM | POA: Insufficient documentation

## 2018-08-04 DIAGNOSIS — X58XXXD Exposure to other specified factors, subsequent encounter: Secondary | ICD-10-CM | POA: Insufficient documentation

## 2018-08-04 DIAGNOSIS — Z79899 Other long term (current) drug therapy: Secondary | ICD-10-CM | POA: Insufficient documentation

## 2018-08-04 DIAGNOSIS — I10 Essential (primary) hypertension: Secondary | ICD-10-CM

## 2018-08-04 DIAGNOSIS — S81802D Unspecified open wound, left lower leg, subsequent encounter: Secondary | ICD-10-CM | POA: Insufficient documentation

## 2018-08-04 DIAGNOSIS — Z86718 Personal history of other venous thrombosis and embolism: Secondary | ICD-10-CM | POA: Insufficient documentation

## 2018-08-04 DIAGNOSIS — L089 Local infection of the skin and subcutaneous tissue, unspecified: Secondary | ICD-10-CM

## 2018-08-04 DIAGNOSIS — L88 Pyoderma gangrenosum: Secondary | ICD-10-CM

## 2018-08-04 DIAGNOSIS — Z833 Family history of diabetes mellitus: Secondary | ICD-10-CM | POA: Insufficient documentation

## 2018-08-04 DIAGNOSIS — Z8249 Family history of ischemic heart disease and other diseases of the circulatory system: Secondary | ICD-10-CM | POA: Insufficient documentation

## 2018-08-04 MED ORDER — TRAMADOL HCL 50 MG PO TABS: 50.0000 mg | ORAL_TABLET | Freq: Two times a day (BID) | ORAL | 0 refills | Status: AC | PRN

## 2018-08-04 MED ORDER — HYDROCHLOROTHIAZIDE 25 MG PO TABS: 25.0000 mg | ORAL_TABLET | Freq: Every day | ORAL | 0 refills | Status: DC

## 2018-08-04 MED ORDER — CLINDAMYCIN HCL 300 MG PO CAPS: 300.0000 mg | ORAL_CAPSULE | Freq: Three times a day (TID) | ORAL | 0 refills | Status: AC

## 2018-08-04 MED ORDER — AMLODIPINE BESYLATE 5 MG PO TABS: 5.0000 mg | ORAL_TABLET | Freq: Every day | ORAL | 6 refills | Status: DC

## 2018-08-04 MED FILL — traMADol HCL 50 MG TABS: 50 | 30 days supply | Qty: 60 | Fill #0

## 2018-08-04 MED FILL — HYDROCHLOROTHIAZIDE 25 MG T: 25 | 30 days supply | Qty: 30 | Fill #0

## 2018-08-04 MED FILL — AMLODIPINE BESYLATE 5 MG TA: 5 | 30 days supply | Qty: 30 | Fill #0

## 2018-08-04 MED FILL — CLINDAMYCIN HCL 300 MG CAP: 300 | 10 days supply | Qty: 30 | Fill #0

## 2018-08-04 NOTE — Progress Notes (Signed)
Flu:0 Pain: 0 Patient has wound on left lower leg

## 2018-08-04 NOTE — Progress Notes (Signed)
Subjective:    Patient ID: Cheryl Mckinney, female    DOB: Oct 18, 1966, 52 y.o.   MRN: [id number]  HPI 52 year old female who was seen in follow-up of recent visit here on 07/17/2018 at which time she was diagnosed with pyoderma gangrenosum.  Patient also with elevated blood pressure of 177/122 and patient was placed on hydrochlorothiazide.  Patient was treated with clindamycin, prednisone and zinc oxide paste as well as tramadol for pain.  Patient returns at today's visit for recheck of her wound and blood pressure follow-up.  Patient's blood pressure today's visit initially elevated at 160/92.      Patient states that initially she has 3 separate open areas on her left lower leg but after starting the medications prescribed at her last visit the areas all joined into one large open area. Patient states that the drainage from the area has stopped. Patient still gets occasional sharp shooting pain in the area of the wound. Pain tends to occur more so at night and awakens her from sleep and it hurts at times to have the bed sheets touch her leg. Patient feels as if she still has swelling in the area of her wound and her leg has been warm to touch in the area of the wound. Past Medical History:  Diagnosis Date  . DVT (deep venous thrombosis) (HCC) 2016  . Hypertension    Past Surgical History:  Procedure Laterality Date  . CESAREAN SECTION     x5  . TUBAL LIGATION     Family History  Problem Relation Age of Onset  . Hypertension Mother   . Diabetes Mother    Social History   Tobacco Use  . Smoking status: Current Every Day Smoker    Packs/day: 0.50    Types: Cigarettes  . Smokeless tobacco: Never Used  Substance Use Topics  . Alcohol use: Yes    Comment: once-twice weeks.   . Drug use: Yes    Types: Marijuana  No Known Allergies   Review of Systems  Constitutional: Positive for fatigue. Negative for chills and fever.  Respiratory: Negative for cough and shortness of breath.     Cardiovascular: Positive for leg swelling. Negative for chest pain and palpitations.  Gastrointestinal: Negative for abdominal pain and nausea.  Genitourinary: Negative for dysuria and frequency.  Musculoskeletal: Positive for myalgias. Negative for joint swelling.  Neurological: Negative for dizziness and headaches.       Objective:   Physical Exam BP (!) 160/92   Pulse 88   Temp 99.2 F (37.3 C) (Oral)   Resp 18   Ht 5\' 11"  (1.803 m)   Wt 212 lb (96.2 kg)   LMP  (LMP Unknown)   SpO2 95%   BMI 29.57 kg/m   Gen-WNWD female in NAD sitting on exam table with her legs extended Lungs- CTA  CV- RRR ABD-soft and non-tender Skin- patient with a large shallow open wound on the left anterio-lateral left lower leg-area is somewhat irregular and measures 2.8 inches across at widest point by 2.6 inches from top to bottom- area has granulomatous appearing "proud flesh" but also some areas of yellow exudate - there is some yellow thin drainage on the paper beneath patient's leg on exam table; surrounding skin on the left lower leg is slightly darker and has edema and is warm to touch        Assessment & Plan:  1. Infected wound Patient with an open infected wound to the left lower leg. I have  not previously seen patient for this problem but she has had multiple ED visits and by her description, the wound may have increased in size. Patient will be referred to wound care for further evaluation and treatment. Will check CBC to look for elevated WBC and will have patient restart the clindamycin that she was previously taking - AMB referral to wound care center - CBC with Differential - clindamycin (CLEOCIN) 300 MG capsule; Take 1 capsule (300 mg total) by mouth 3 (three) times daily for 10 days.  Dispense: 30 capsule; Refill: 0  2. Pyoderma gangrenosa Patient's wound per chart review was thought to be a form of pyoderma gangrenosa and patient [facility] but no longer afford care there. I could not access her derm notes from there but patient may also need ongoing derm follow-up but at this time will try to get her seen by local wound care so that area begins to heal - AMB referral to wound care center - CBC with Differential - traMADol (ULTRAM) 50 MG tablet; Take 1 tablet (50 mg total) by mouth every 12 (twelve) hours as needed.  Dispense: 60 tablet; Refill: 0 - clindamycin (CLEOCIN) 300 MG capsule; Take 1 capsule (300 mg total) by mouth 3 (three) times daily for 10 days.  Dispense: 30 capsule; Refill: 0  3. Essential hypertension BP remains uncontrolled and norvasc 5 mg will be added and can be increased if BP is still high at follow-up - Basic Metabolic Panel - amLODipine (NORVASC) 5 MG tablet; Take 1 tablet (5 mg total) by mouth daily.  Dispense: 30 tablet; Refill: 6 - hydrochlorothiazide (HYDRODIURIL) 25 MG tablet; Take 1 tablet (25 mg total) by mouth daily.  Dispense: 90 tablet; Refill: 0  An After Visit Summary was printed and given to the patient.  Return in about 1 week (around 08/11/2018).

## 2018-08-05 ENCOUNTER — Telehealth: Payer: Self-pay

## 2018-08-05 LAB — CBC WITH DIFFERENTIAL/PLATELET
Basophils Absolute: 0 x10E3/uL (ref 0.0–0.2)
Basos: 0 %
EOS (ABSOLUTE): 0 x10E3/uL (ref 0.0–0.4)
Eos: 0 %
Hematocrit: 41 % (ref 34.0–46.6)
Hemoglobin: 14.2 g/dL (ref 11.1–15.9)
Immature Grans (Abs): 0 x10E3/uL (ref 0.0–0.1)
Immature Granulocytes: 1 %
Lymphocytes Absolute: 0.9 x10E3/uL (ref 0.7–3.1)
Lymphs: 11 %
MCH: 29.3 pg (ref 26.6–33.0)
MCHC: 34.6 g/dL (ref 31.5–35.7)
MCV: 85 fL (ref 79–97)
Monocytes Absolute: 0.3 x10E3/uL (ref 0.1–0.9)
Monocytes: 4 %
Neutrophils Absolute: 7.1 x10E3/uL — ABNORMAL HIGH (ref 1.4–7.0)
Neutrophils: 84 %
Platelets: 194 x10E3/uL (ref 150–450)
RBC: 4.85 x10E6/uL (ref 3.77–5.28)
RDW: 14.2 % (ref 12.3–15.4)
WBC: 8.4 x10E3/uL (ref 3.4–10.8)

## 2018-08-05 LAB — BASIC METABOLIC PANEL WITH GFR
BUN/Creatinine Ratio: 23 (ref 9–23)
BUN: 21 mg/dL (ref 6–24)
CO2: 33 mmol/L — ABNORMAL HIGH (ref 20–29)
Calcium: 9.8 mg/dL (ref 8.7–10.2)
Chloride: 92 mmol/L — ABNORMAL LOW (ref 96–106)
Creatinine, Ser: 0.92 mg/dL (ref 0.57–1.00)
GFR calc Af Amer: 84 mL/min/1.73
GFR calc non Af Amer: 73 mL/min/1.73
Glucose: 144 mg/dL — ABNORMAL HIGH (ref 65–99)
Potassium: 3.6 mmol/L (ref 3.5–5.2)
Sodium: 140 mmol/L (ref 134–144)

## 2018-08-05 NOTE — Telephone Encounter (Signed)
Patient was called, no answer, lvm to return call. 

## 2018-08-05 NOTE — Telephone Encounter (Signed)
Patient called, verified DOB, and was given most recent lab results. Patient verbalized understanding and had no further questions.

## 2018-08-05 NOTE — Telephone Encounter (Signed)
-----   Message from Cain Saupeammie Fulp, MD sent at 08/05/2018 12:27 PM EDT ----- Notify patient that her CBC was normal but she still needs to continue antibiotic therapy and follow-up with wound care. Her BMP indicated that her blood sugar was elevated at 144. Will check a Hgb A1c at her next visit to see how her blood sugar has been over 90 days and check glucose level at next visit

## 2018-08-11 ENCOUNTER — Ambulatory Visit: Payer: Self-pay | Attending: Nurse Practitioner | Admitting: Family Medicine

## 2018-08-11 ENCOUNTER — Encounter: Payer: Self-pay | Admitting: Family Medicine

## 2018-08-11 VITALS — BP 144/73 | HR 85 | Temp 99.1°F | Resp 16 | Wt 211.0 lb

## 2018-08-11 DIAGNOSIS — T148XXA Other injury of unspecified body region, initial encounter: Secondary | ICD-10-CM

## 2018-08-11 DIAGNOSIS — X58XXXA Exposure to other specified factors, initial encounter: Secondary | ICD-10-CM | POA: Insufficient documentation

## 2018-08-11 DIAGNOSIS — F17210 Nicotine dependence, cigarettes, uncomplicated: Secondary | ICD-10-CM | POA: Insufficient documentation

## 2018-08-11 DIAGNOSIS — I10 Essential (primary) hypertension: Secondary | ICD-10-CM

## 2018-08-11 DIAGNOSIS — L089 Local infection of the skin and subcutaneous tissue, unspecified: Secondary | ICD-10-CM

## 2018-08-11 DIAGNOSIS — Z86718 Personal history of other venous thrombosis and embolism: Secondary | ICD-10-CM | POA: Insufficient documentation

## 2018-08-11 DIAGNOSIS — S81802A Unspecified open wound, left lower leg, initial encounter: Secondary | ICD-10-CM | POA: Insufficient documentation

## 2018-08-11 MED ORDER — TETANUS-DIPHTH-ACELL PERTUSSIS 5-2.5-18.5 LF-MCG/0.5 IM SUSP: 0.5000 mL | Freq: Once | INTRAMUSCULAR | 0 refills | Status: AC

## 2018-08-11 NOTE — Patient Instructions (Signed)
Td Vaccine (Tetanus and Diphtheria): What You Need to Know 1. Why get vaccinated? Tetanus  and diphtheria are very serious diseases. They are rare in the [place] today, but people who do become infected often have severe complications. Td vaccine is used to protect adolescents and adults from both of these diseases. Both tetanus and diphtheria are infections caused by bacteria. Diphtheria spreads from person to person through coughing or sneezing. Tetanus-causing bacteria enter the body through cuts, scratches, or wounds. TETANUS (lockjaw) causes painful muscle tightening and stiffness, usually all over the body.  It can lead to tightening of muscles in the head and neck so you can't open your mouth, swallow, or sometimes even breathe. Tetanus kills about 1 out of every 10 people who are infected even after receiving the best medical care.  DIPHTHERIA can cause a thick coating to form in the back of the throat.  It can lead to breathing problems, paralysis, heart failure, and death.  Before vaccines, as many as 200,000 cases of diphtheria and hundreds of cases of tetanus were reported in the [place] each year. Since vaccination began, reports of cases for both diseases have dropped by about 99%. 2. Td vaccine Td vaccine can protect adolescents and adults from tetanus and diphtheria. Td is usually given as a booster dose every 10 years but it can also be given earlier after a severe and dirty wound or burn. Another vaccine, called Tdap, which protects against pertussis in addition to tetanus and diphtheria, is sometimes recommended instead of Td vaccine. Your doctor or the person giving you the vaccine can give you more information. Td may safely be given at the same time as other vaccines. 3. Some people should not get this vaccine  A person who has ever had a life-threatening allergic reaction after a previous dose of any tetanus or diphtheria containing vaccine, OR has a severe  allergy to any part of this vaccine, should not get Td vaccine. Tell the person giving the vaccine about any severe allergies.  Talk to your doctor if you: ? had severe pain or swelling after any vaccine containing diphtheria or tetanus, ? ever had a condition called Guillain Barre Syndrome (GBS), ? aren't feeling well on the day the shot is scheduled. 4. What are the risks from Td vaccine? With any medicine, including vaccines, there is a chance of side effects. These are usually mild and go away on their own. Serious reactions are also possible but are rare. Most people who get Td vaccine do not have any problems with it. Mild problems following Td vaccine: (Did not interfere with activities)  Pain where the shot was given (about 8 people in 10)  Redness or swelling where the shot was given (about 1 person in 4)  Mild fever (rare)  Headache (about 1 person in 4)  Tiredness (about 1 person in 4)  Moderate problems following Td vaccine: (Interfered with activities, but did not require medical attention)  Fever over 102F (rare)  Severe problems following Td vaccine: (Unable to perform usual activities; required medical attention)  Swelling, severe pain, bleeding and/or redness in the arm where the shot was given (rare).  Problems that could happen after any vaccine:  People sometimes faint after a medical procedure, including vaccination. Sitting or lying down for about 15 minutes can help prevent fainting, and injuries caused by a fall. Tell your doctor if you feel dizzy, or have vision changes or ringing in the ears.  Some people get   severe pain in the shoulder and have difficulty moving the arm where a shot was given. This happens very rarely.  Any medication can cause a severe allergic reaction. Such reactions from a vaccine are very rare, estimated at fewer than 1 in a million doses, and would happen within a few minutes to a few hours after the vaccination. As with any  medicine, there is a very remote chance of a vaccine causing a serious injury or death. The safety of vaccines is always being monitored. For more information, visit: www.cdc.gov/vaccinesafety/ 5. What if there is a serious reaction? What should I look for? Look for anything that concerns you, such as signs of a severe allergic reaction, very high fever, or unusual behavior. Signs of a severe allergic reaction can include hives, swelling of the face and throat, difficulty breathing, a fast heartbeat, dizziness, and weakness. These would usually start a few minutes to a few hours after the vaccination. What should I do?  If you think it is a severe allergic reaction or other emergency that can't wait, call 9-1-1 or get the person to the nearest hospital. Otherwise, call your doctor.  Afterward, the reaction should be reported to the Vaccine Adverse Event Reporting System (VAERS). Your doctor might file this report, or you can do it yourself through the VAERS web site at www.vaers.hhs.gov, or by calling 1-800-822-7967. ? VAERS does not give medical advice. 6. The National Vaccine Injury Compensation Program The National Vaccine Injury Compensation Program (VICP) is a federal program that was created to compensate people who may have been injured by certain vaccines. Persons who believe they may have been injured by a vaccine can learn about the program and about filing a claim by calling 1-800-338-2382 or visiting the VICP website at www.hrsa.gov/vaccinecompensation. There is a time limit to file a claim for compensation. 7. How can I learn more?  Ask your doctor. He or she can give you the vaccine package insert or suggest other sources of information.  Call your local or state health department.  Contact the Centers for Disease Control and Prevention (CDC): ? Call 1-800-232-4636 (1-800-CDC-INFO) ? Visit CDC's website at www.cdc.gov/vaccines CDC Td Vaccine VIS (02/27/16) This information is  not intended to replace advice given to you by your health care provider. Make sure you discuss any questions you have with your health care provider. Document Released: 09/01/2006 Document Revised: 07/25/2016 Document Reviewed: 07/25/2016 Elsevier Interactive Patient Education  2017 Elsevier Inc.  

## 2018-08-11 NOTE — Progress Notes (Signed)
Subjective:    Patient ID: Cheryl Mckinney, female    DOB: August 29, 1966, 52 y.o.   MRN: [id number]  HPI 52 year old female who is being seen in follow-up of a non-healing wound on her left lower leg as well as hypertension.  Patient states that she was not able to afford to get the antibiotics and pain medication that was prescribed at her last visit filled until a few days ago.  Patient states that while she was not on the antibiotic, she started to have a yellowish drainage from the wound site as well as some increased pain in her lower leg.  Patient reports no pain at today's visit as she states that she took tramadol before her visit.  Patient states that the drainage from her leg stopped a few days after she restarted the antibiotic.  Patient denies any fever or chills.  No headaches or dizziness.  No abdominal pain or nausea.  Patient states that she did not hear anything regarding the referral that was placed to wound care at her last visit.  Patient reports that she has been taking the blood pressure medication without any issues.   Patient states that she has been using Vaseline to the wound area as she feels that this is helping with the healing.  Patient does continue to smoke but knows that she needs to quit. Past Medical History:  Diagnosis Date  . DVT (deep venous thrombosis) (HCC) 2016  . Hypertension    Past Surgical History:  Procedure Laterality Date  . CESAREAN SECTION     x5  . TUBAL LIGATION     Family History  Problem Relation Age of Onset  . Hypertension Mother   . Diabetes Mother    Social History   Tobacco Use  . Smoking status: Current Every Day Smoker    Packs/day: 0.50    Types: Cigarettes  . Smokeless tobacco: Never Used  Substance Use Topics  . Alcohol use: Yes    Comment: once-twice weeks.   . Drug use: Yes    Types: Marijuana  No Known Allergies   Review of Systems  Constitutional: Positive for fatigue. Negative for chills and fever.  Respiratory:  Negative for cough and shortness of breath.   Cardiovascular: Positive for leg swelling (mild swelling around the area of the wound on the left lower leg). Negative for chest pain and palpitations.  Gastrointestinal: Negative for abdominal pain and nausea.  Genitourinary: Negative for dysuria and frequency.  Neurological: Negative for dizziness and headaches.       Objective:   Physical Exam BP (!) 144/73   Pulse 85   Temp 99.1 F (37.3 C) (Oral)   Resp 16   Wt 211 lb (95.7 kg)   LMP  (LMP Unknown)   SpO2 97%   BMI 29.43 kg/m Nurse's notes and vital signs reviewed General- well-nourished, well-developed overweight older female in no acute distress Lungs-clear to auscultation bilaterally Cardiovascular-regular rate and rhythm Abdomen-soft, nontender Skin- patient with a large open wound to the left lateral lower leg.  Surface of the wound is slightly shiny and patient states that she applied Vaseline to the area prior to the visit.  Patient does appear to have some areas of tan/muted yellow discharge within the wound.  Patient does appear to have good granulation tissue at the base of the wound with mild erythema.  Patient with a mild increase in temperature on the lower leg around the area of the wound but less so than at  last visit Other- patient with 1+ peripheral pulse at dorsalis pedis on the right        Assessment & Plan:  1. Infected wound Patient was not able to obtain her antibiotics after her last visit and only restarted use of antibiotics this week.  Patient reports that she did have an increase in pain and drainage from the wound site prior to the restart of her antibiotic.  Attempt was made to obtain a wound culture but this may have been impaired because patient had applied Vaseline to the wound area.  Patient is to continue her antibiotics and prescription for pain medication as needed.  Patient states that she had not been contacted after her last visit regarding her  referral to wound care and therefore another wound care referral will be placed. - WOUND CULTURE - AMB referral to wound care center  2. Essential hypertension Patient's blood pressure is improved with the use of amlodipine which patient will continue.  An After Visit Summary was printed and given to the patient.  Return in about 2 weeks (around 08/25/2018).

## 2018-08-14 ENCOUNTER — Other Ambulatory Visit: Payer: Self-pay | Admitting: Family Medicine

## 2018-08-14 DIAGNOSIS — T148XXA Other injury of unspecified body region, initial encounter: Principal | ICD-10-CM

## 2018-08-14 DIAGNOSIS — L089 Local infection of the skin and subcutaneous tissue, unspecified: Secondary | ICD-10-CM

## 2018-08-14 LAB — WOUND CULTURE

## 2018-08-14 MED ORDER — AMOXICILLIN-POT CLAVULANATE 500-125 MG PO TABS: 1.0000 | ORAL_TABLET | Freq: Two times a day (BID) | ORAL | 0 refills | Status: AC

## 2018-08-14 NOTE — Progress Notes (Signed)
Patient's wound culture showed growth of bacteria sensitive to penicillin related antibiotics. New RX for augmentin will be sent into patient's pharmacy and patient will be notified of the abnormal wound culture and the need for new medication

## 2018-08-17 ENCOUNTER — Telehealth: Payer: Self-pay

## 2018-08-17 NOTE — Telephone Encounter (Signed)
Attempted to contact Ms Downs as requested by Dr Jillyn Hidden regarding pt's wound culture and the change of her antibiotic as documented in her note below:   Notes recorded by Cain Saupe, MD on 08/14/2018 at 8:18 PM EDT Please notify patient that her wound culture did show the growth of bacteria which are sensitive to penicillin type antibiotics and a prescription has been sent to this pharmacy for her to pick up for new antibiotic, Augmentin. Please also see if patient has been contacted regarding referral to wound care      Message left on voicemail to call the office as soon as possible.

## 2018-08-18 ENCOUNTER — Encounter (HOSPITAL_BASED_OUTPATIENT_CLINIC_OR_DEPARTMENT_OTHER): Payer: Self-pay | Attending: Internal Medicine

## 2018-08-18 NOTE — Telephone Encounter (Signed)
Day 2 attempted contact with patient regarding her wound culture.  Requested patient call [facility] to speak to triage nurse.  Left voice mail.

## 2018-08-19 NOTE — Telephone Encounter (Signed)
Third day of leaving VM messages for pt to call [facility] for a message from her doctor and the office's phone number. Triage will not make further attempts.

## 2018-08-20 ENCOUNTER — Encounter: Payer: Self-pay | Admitting: *Deleted

## 2018-08-20 ENCOUNTER — Telehealth: Payer: Self-pay

## 2018-08-20 NOTE — Telephone Encounter (Signed)
Pt called triage nurse.  Results of wound culture reported and instructed pt to pick up Augmentin prescription here at [facility]. Pt then said that she just fell at work and she was observed to hit her head.  She said she does not remember this happening so she thinks she "blacked out".  Pt said this occurred 20 minutes ago and she is on her way to [facility]. She said she will start antibiotic when she gets out of the hospital.  I instructed pt to call us at [facility] if she has questions or problems

## 2018-08-28 MED FILL — predniSONE 20 MG TABS: 20 | 30 days supply | Qty: 90 | Fill #1

## 2018-08-28 MED FILL — AMOX-CLAV 500-125 MG TABLET: 500-125 | 10 days supply | Qty: 20 | Fill #0

## 2018-09-01 ENCOUNTER — Ambulatory Visit: Payer: Self-pay | Admitting: Family Medicine

## 2018-09-22 ENCOUNTER — Encounter: Payer: Self-pay | Admitting: Family Medicine

## 2018-09-22 ENCOUNTER — Other Ambulatory Visit: Payer: Self-pay

## 2018-09-22 ENCOUNTER — Ambulatory Visit: Payer: Self-pay | Attending: Family Medicine | Admitting: Family Medicine

## 2018-09-22 VITALS — BP 176/98 | HR 68 | Temp 98.8°F | Resp 18 | Ht 71.0 in | Wt 231.8 lb

## 2018-09-22 DIAGNOSIS — T148XXD Other injury of unspecified body region, subsequent encounter: Secondary | ICD-10-CM

## 2018-09-22 DIAGNOSIS — L089 Local infection of the skin and subcutaneous tissue, unspecified: Secondary | ICD-10-CM

## 2018-09-22 DIAGNOSIS — I10 Essential (primary) hypertension: Secondary | ICD-10-CM

## 2018-09-22 DIAGNOSIS — R262 Difficulty in walking, not elsewhere classified: Secondary | ICD-10-CM | POA: Insufficient documentation

## 2018-09-22 DIAGNOSIS — L88 Pyoderma gangrenosum: Secondary | ICD-10-CM

## 2018-09-22 DIAGNOSIS — T148XXA Other injury of unspecified body region, initial encounter: Secondary | ICD-10-CM

## 2018-09-22 DIAGNOSIS — M79605 Pain in left leg: Secondary | ICD-10-CM

## 2018-09-22 MED ORDER — HYDROCHLOROTHIAZIDE 25 MG PO TABS: 25.0000 mg | ORAL_TABLET | Freq: Every day | ORAL | 1 refills | Status: DC

## 2018-09-22 MED ORDER — TRAMADOL HCL 50 MG PO TABS: 50.0000 mg | ORAL_TABLET | Freq: Three times a day (TID) | ORAL | 0 refills | Status: AC | PRN

## 2018-09-22 MED ORDER — AMLODIPINE BESYLATE 5 MG PO TABS: 5.0000 mg | ORAL_TABLET | Freq: Every day | ORAL | 6 refills | Status: DC

## 2018-09-22 MED ORDER — CEFTRIAXONE SODIUM 250 MG IJ SOLR
250.0000 mg | Freq: Once | INTRAMUSCULAR | Status: AC
  Administered 2018-09-22: 250 mg via INTRAMUSCULAR

## 2018-09-22 MED ORDER — AMOXICILLIN-POT CLAVULANATE 500-125 MG PO TABS: 1.0000 | ORAL_TABLET | Freq: Two times a day (BID) | ORAL | 0 refills | Status: AC

## 2018-09-22 MED FILL — HYDROCHLOROTHIAZIDE 25 MG T: 25 | 30 days supply | Qty: 30 | Fill #0

## 2018-09-22 MED FILL — traMADol HCL 50 MG TABS: 50 | 30 days supply | Qty: 90 | Fill #0

## 2018-09-22 MED FILL — AMOX-CLAV 500-125 MG TABLET: 500-125 | 14 days supply | Qty: 28 | Fill #0

## 2018-09-22 MED FILL — AMLODIPINE BESYLATE 5 MG TA: 5 | 30 days supply | Qty: 30 | Fill #0

## 2018-09-22 NOTE — Progress Notes (Signed)
Subjective:    Patient ID: Cheryl Mckinney, female    DOB: July 09, 1966, 52 y.o.   MRN: [id number]  HPI        52 year old female who was seen in follow-up of an infected open wound on the left lower leg.  Patient was last seen on 08/11/2018 and was to have follow-up in 2 weeks which she did not keep.  Patient states that she did pick up the Augmentin and has now completed the antibiotic.  Patient feels as if the wound is getting smaller.  Patient continues to have some bloody drainage from the area.  Patient states that she has run out of her blood pressure medication and her last dose was this past Friday.  Patient denies headaches or dizziness related to her blood pressure.  Patient continues to have pain in her left lower leg secondary to open wound.  Patient with complaint of intermittent sharp pain with sudden onset that can last for 30 minutes to an hour.  Patient states that when the pain occurs, she has difficulty walking and sometimes has to call out of work secondary to the pain.  Patient states that the pain is recurrent throughout the day.  Patient states that the pain is about a 8 on a 0-to-10 scale.  Patient denies any fever or chills.  No chest pain or palpitations, no shortness of breath or cough.  Patient reports that she did receive an appointment with wound care however she could not afford the copayment and therefore she did not attend.      On review of chart, there is a telephone encounter from 08/20/2018 with patient called to state that she had passed out at work and was going to the emergency department for further follow-up.  Patient states that she did go to the hospital, [facility] and was told that she was dehydrated.  I cannot however find any records that patient went to the emergency department or had any recent labs done through the emergency department.    Review of Systems  Constitutional: Positive for fatigue. Negative for chills.  HENT: Negative for sore throat and  trouble swallowing.   Respiratory: Negative for cough and shortness of breath.   Cardiovascular: Positive for leg swelling (On the left lower leg in the area of her open wound). Negative for chest pain and palpitations.  Gastrointestinal: Negative for abdominal pain, constipation, diarrhea and nausea.  Endocrine: Negative for polydipsia, polyphagia and polyuria.  Genitourinary: Negative for dysuria and frequency.  Musculoskeletal: Positive for gait problem and myalgias. Negative for arthralgias, back pain and joint swelling.  Neurological: Negative for dizziness, light-headedness and headaches.       Objective:   Physical Exam BP (!) 176/98   Pulse 68   Temp 98.8 F (37.1 C) (Oral)   Resp 18   Ht 5\' 11"  (1.803 m)   Wt 231 lb 12.8 oz (105.1 kg)   SpO2 99%   BMI 32.33 kg/m  Nurse's notes and vital signs reviewed. General-well-nourished, well-developed, overweight but tall, larger framed female in no acute distress Lungs-clear to auscultation bilaterally Cardiovascular-regular rate and rhythm Abdomen-soft, nontender Back-no CVA tenderness Extremities- no right lower extremity edema but patient with left lower extremity edema in the area of the open wound Skin- patient with an open wound on the left anterior shin and extending to the left lateral shin/slightly posteriorly.  Wound is 7-1/2 cm at its widest point and 6 cm vertically at its highest point.  Wound margins are  uneven/irregular.  There does appear to be good granulation tissue/proud flesh.  Patient with some thin, clear to slightly red drainage from the area-serosanguineous type drainage but no mucopurulent drainage at today's visit.  Patient does have edema and increased warmth of the surrounding skin.        Assessment & Plan:  1. Infected open wound Patient had wound culture showing sensitivity to Augmentin.  Patient prescribed Augmentin but also given an injection of Rocephin 500 mg IM x1 here in the office.  Patient is  also provided with prescription for tramadol to take as needed for pain, 30-day supply 1 p.o. every 8 #90 with no refill.  Patient will return to clinic in 2 weeks for reevaluation.  Patient is encouraged to change dressing daily, sooner if dressing is saturated with drainage.  Patient did not keep appointment with wound care secondary to the cost but will be referred to either wound care or surgery at her next visit if the wound has not decreased in size- cefTRIAXone (ROCEPHIN) injection 250 mg - cefTRIAXone (ROCEPHIN) injection 250 mg - amoxicillin-clavulanate (AUGMENTIN) 500-125 MG tablet; Take 1 tablet (500 mg total) by mouth 2 (two) times daily for 14 days. Take after eating  Dispense: 28 tablet; Refill: 0 - traMADol (ULTRAM) 50 MG tablet; Take 1 tablet (50 mg total) by mouth every 8 (eight) hours as needed for moderate pain. 30 day supply; no early refill  Dispense: 90 tablet; Refill: 0  2. Pyoderma gangrenosa Patient with initial diagnosis of pyoderma gangrenosum as the cause of her open wound on review of past records from the emergency department - amoxicillin-clavulanate (AUGMENTIN) 500-125 MG tablet; Take 1 tablet (500 mg total) by mouth 2 (two) times daily for 14 days. Take after eating  Dispense: 28 tablet; Refill: 0 - traMADol (ULTRAM) 50 MG tablet; Take 1 tablet (50 mg total) by mouth every 8 (eight) hours as needed for moderate pain. 30 day supply; no early refill  Dispense: 90 tablet; Refill: 0  3. Essential hypertension Patient reports that she is currently out of her blood pressure medication and new prescriptions provided for amlodipine and hydrochlorothiazide.  Patient will follow-up in 2 weeks and blood pressure will be rechecked at that time.  Patient should call or return sooner if she is having headaches, dizziness or any continued issues with the blood pressure. - amLODipine (NORVASC) 5 MG tablet; Take 1 tablet (5 mg total) by mouth daily.  Dispense: 30 tablet; Refill: 6 -  hydrochlorothiazide (HYDRODIURIL) 25 MG tablet; Take 1 tablet (25 mg total) by mouth daily.  Dispense: 90 tablet; Refill: 1  4. Leg pain, inferior, left Patient with complaint of left lower leg pain in the area of her open wound.  Prescription provided for tramadol to take 1 every 8 hours as needed for moderate to severe pain.  Patient given #90 with no refill which will be a 30-day supply and no early refills will be provided - traMADol (ULTRAM) 50 MG tablet; Take 1 tablet (50 mg total) by mouth every 8 (eight) hours as needed for moderate pain. 30 day supply; no early refill  Dispense: 90 tablet; Refill: 0  An After Visit Summary was printed and given to the patient.  Return in about 2 weeks (around 10/06/2018).

## 2018-09-22 NOTE — Patient Instructions (Signed)
How to Change Your Dressing A dressing is a material that is placed in and over wounds. A dressing helps your wound to heal by protecting it from:  Bacteria.  Worse injury.  Being too dry or too wet.  What are the risks? The sticky (adhesive) tape that is used with a dressing may make your skin sore or irritated, or it may cause a rash. These are the most common problems. However, more serious problems can develop, such as:  Bleeding.  Infection.  How to change your dressing Getting Ready to Change Your Dressing   Take a shower before you do the first dressing change of the day. If your doctor does not want your wound to get wet and your dressing is not waterproof, you may need to put plastic leak-proof sealing wrap on your dressing to protect it.  If needed, take pain medicine as told by your doctor 30 minutes before you change your dressing.  Set up a clean station for wound care. You will need: ? A plastic trash bag that is open and ready to use. ? Hand sanitizer. ? Wound cleanser or salt-water solution (saline) as told by your doctor. ? New dressing material or bandages. Make sure to open the dressing package so the dressing stays on the inside of the package. You may also need these supplies in your clean station:  A box of vinyl gloves.  Tape.  Skin protectant. This may be a wipe, film, or spray.  Clean or germ-free (sterile) scissors.  A cotton-tipped applicator.  Taking Off Your Old Dressing  Wash your hands with soap and water. Dry your hands with a clean towel. If you cannot use soap and water, use hand sanitizer.  If you are using gloves, put on the gloves before you take off the dressing.  Gently take off any adhesive or tape by pulling it off in the direction of your hair growth. Only touch the outside edges of the dressing.  Take off the dressing. If the dressing sticks to your skin, wet the dressing with a germ-free salt-water solution. This helps it  come off more easily.  Take off any gauze or packing in your wound.  Throw the old dressing supplies into the ready trash bag.  Take off your gloves. To take off each glove, grab the cuff with your other hand and turn the glove inside out. Put the gloves in the trash right away.  Wash your hands with soap and water. Dry your hands with a clean towel. If you cannot use soap and water, use hand sanitizer. Cleaning Your Wound  Follow instructions from your doctor about how to clean your wound. This may include using a salt-water solution or recommended wound cleanser.  Do not use over-the-counter medicated or antiseptic creams, sprays, liquids, or dressings unless your doctor tells you to do that.  Use a clean gauze pad to clean the area fully with the salt-water solution or wound cleanser that your doctor recommends.  Throw the gauze pad into the trash bag.  Wash your hands with soap and water. Dry your hands with a clean towel. If you cannot use soap and water, use hand sanitizer. Putting on the Dressing  If your doctor recommended a skin protectant, put it on the skin around the wound.  Cover the wound with the recommended dressing, such as a nonstick gauze or bandage. Make sure to touch only the outside edges of the dressing. Do not touch the inside of the dressing.    Attach the dressing so all sides stay in place. You may do this with the attached medical adhesive, roll gauze, or tape. If you use tape, do not wrap the tape all the way around your arm or leg.  Take off your gloves. Put them in the trash bag with the old dressing. Tie the bag shut and throw it away.  Wash your hands with soap and water. Dry your hands with a clean towel. If you cannot use soap and water, use hand sanitizer. Get help if:   You have new pain.  You have irritation, a rash, or itching around the wound or dressing.  Changing your dressing is painful.  Changing your dressing causes a lot of  bleeding. Get help right away if:  You have very bad pain.  You have signs of infection, such as: ? More redness, swelling, or pain. ? More fluid or blood. ? Warmth. ? Pus or a bad smell. ? Red streaks leading from wound. ? A fever. This information is not intended to replace advice given to you by your health care provider. Make sure you discuss any questions you have with your health care provider. Document Released: 01/31/2009 Document Revised: 04/11/2016 Document Reviewed: 08/10/2015 Elsevier Interactive Patient Education  2018 [employer] Inc.  

## 2018-09-22 NOTE — Progress Notes (Signed)
Pain: left leg, 10

## 2018-10-06 ENCOUNTER — Other Ambulatory Visit: Payer: Self-pay

## 2018-10-06 ENCOUNTER — Encounter: Payer: Self-pay | Admitting: Family Medicine

## 2018-10-06 ENCOUNTER — Ambulatory Visit: Payer: Self-pay | Attending: Family Medicine | Admitting: Family Medicine

## 2018-10-06 VITALS — BP 108/80 | HR 94 | Temp 98.2°F | Resp 18 | Ht 71.0 in | Wt 203.0 lb

## 2018-10-06 DIAGNOSIS — S81802A Unspecified open wound, left lower leg, initial encounter: Secondary | ICD-10-CM | POA: Insufficient documentation

## 2018-10-06 DIAGNOSIS — T148XXA Other injury of unspecified body region, initial encounter: Secondary | ICD-10-CM

## 2018-10-06 DIAGNOSIS — I10 Essential (primary) hypertension: Secondary | ICD-10-CM | POA: Insufficient documentation

## 2018-10-06 DIAGNOSIS — R0789 Other chest pain: Secondary | ICD-10-CM | POA: Insufficient documentation

## 2018-10-06 DIAGNOSIS — I499 Cardiac arrhythmia, unspecified: Secondary | ICD-10-CM | POA: Insufficient documentation

## 2018-10-06 DIAGNOSIS — S81802D Unspecified open wound, left lower leg, subsequent encounter: Secondary | ICD-10-CM

## 2018-10-06 DIAGNOSIS — R079 Chest pain, unspecified: Secondary | ICD-10-CM

## 2018-10-06 DIAGNOSIS — R9431 Abnormal electrocardiogram [ECG] [EKG]: Secondary | ICD-10-CM | POA: Insufficient documentation

## 2018-10-06 DIAGNOSIS — Z86718 Personal history of other venous thrombosis and embolism: Secondary | ICD-10-CM | POA: Insufficient documentation

## 2018-10-06 DIAGNOSIS — L089 Local infection of the skin and subcutaneous tissue, unspecified: Secondary | ICD-10-CM

## 2018-10-06 DIAGNOSIS — F17210 Nicotine dependence, cigarettes, uncomplicated: Secondary | ICD-10-CM

## 2018-10-06 DIAGNOSIS — B9689 Other specified bacterial agents as the cause of diseases classified elsewhere: Secondary | ICD-10-CM | POA: Insufficient documentation

## 2018-10-06 DIAGNOSIS — J209 Acute bronchitis, unspecified: Secondary | ICD-10-CM

## 2018-10-06 DIAGNOSIS — X58XXXA Exposure to other specified factors, initial encounter: Secondary | ICD-10-CM | POA: Insufficient documentation

## 2018-10-06 DIAGNOSIS — F17200 Nicotine dependence, unspecified, uncomplicated: Secondary | ICD-10-CM

## 2018-10-06 DIAGNOSIS — M79605 Pain in left leg: Secondary | ICD-10-CM | POA: Insufficient documentation

## 2018-10-06 DIAGNOSIS — Z8249 Family history of ischemic heart disease and other diseases of the circulatory system: Secondary | ICD-10-CM | POA: Insufficient documentation

## 2018-10-06 DIAGNOSIS — Z79899 Other long term (current) drug therapy: Secondary | ICD-10-CM | POA: Insufficient documentation

## 2018-10-06 DIAGNOSIS — L88 Pyoderma gangrenosum: Secondary | ICD-10-CM

## 2018-10-06 MED ORDER — CEFDINIR 300 MG PO CAPS: 300.0000 mg | ORAL_CAPSULE | Freq: Two times a day (BID) | ORAL | 0 refills | Status: AC

## 2018-10-06 MED ORDER — IPRATROPIUM-ALBUTEROL 0.5-2.5 (3) MG/3ML IN SOLN: 3.0000 mL | Freq: Once | RESPIRATORY_TRACT | Status: DC

## 2018-10-06 MED ORDER — CEFTRIAXONE SODIUM 250 MG IJ SOLR
250.0000 mg | Freq: Once | INTRAMUSCULAR | Status: AC
  Administered 2018-10-06: 250 mg via INTRAMUSCULAR

## 2018-10-06 MED ORDER — PREDNISONE 20 MG PO TABS: ORAL_TABLET | ORAL | 0 refills | Status: DC

## 2018-10-06 NOTE — Progress Notes (Signed)
Subjective:    Patient ID: Cheryl Mckinney, female    DOB: Apr 24, 1966, 52 y.o.   MRN: [id number]  HPI       52 yo female with history of chronic open wound on the left lower extremity which is thought to be secondary to pyoderma gangrenosa who presents secondary to complaint of onset since last Wednesday of central chest pain which slightly worsens with breathing. It is a slightly burning sensation.   Patient also with complaint of having more than 1 week of sudden onset of drenching sweating.  When patient has the chest pain, she denies any nausea, no radiation of pain to the neck or throat and no radiation of pain to the left arm.  Patient denies any significant coughing.  Patient denies congestion but states that she has had dryness in her lip secondary to breathing through her mouth.  Patient denies any increase in left lower leg pain until today and patient states that this may be secondary to the weather. Leg pain is dull and aching in about a 3 or 4 on a 0-to-10 scale but then patient occasionally gets sharp shooting pain at the wound site that is between 8 on a 10 on a 0-to-10 scale.  Patient states that she has seen some increased drainage on her bandages from the wound site.  Patient does not recall the exact color.  Patient does continue to smoke but states that she is only smoking about 5 cigarettes/day.  Past Medical History:  Diagnosis Date  . DVT (deep venous thrombosis) (HCC) 2016  . Hypertension    Past Surgical History:  Procedure Laterality Date  . CESAREAN SECTION     x5  . TUBAL LIGATION     Family History  Problem Relation Age of Onset  . Hypertension Mother   . Diabetes Mother    Social History   Tobacco Use  . Smoking status: Current Every Day Smoker    Packs/day: 0.50    Types: Cigarettes  . Smokeless tobacco: Never Used  Substance Use Topics  . Alcohol use: Not Currently    Comment: once-twice weeks.   . Drug use: Yes    Types: Marijuana  No Known  Allergies   Current Outpatient Medications:  .  amLODipine (NORVASC) 5 MG tablet, Take 1 tablet (5 mg total) by mouth daily., Disp: 30 tablet, Rfl: 6 .  amoxicillin-clavulanate (AUGMENTIN) 500-125 MG tablet, Take 1 tablet (500 mg total) by mouth 2 (two) times daily for 14 days. Take after eating, Disp: 28 tablet, Rfl: 0 .  hydrochlorothiazide (HYDRODIURIL) 25 MG tablet, Take 1 tablet (25 mg total) by mouth daily., Disp: 90 tablet, Rfl: 1 .  traMADol (ULTRAM) 50 MG tablet, Take 1 tablet (50 mg total) by mouth every 8 (eight) hours as needed for moderate pain. 30 day supply; no early refill, Disp: 90 tablet, Rfl: 0 .  Zinc Oxide (TRIPLE PASTE) 12.8 % ointment, Apply 1 application topically as needed for irritation., Disp: 56.7 g, Rfl: 1    Review of Systems  Constitutional: Positive for diaphoresis and fatigue. Negative for chills and fever.  HENT: Negative for congestion, ear pain, sinus pressure, sinus pain, sore throat and trouble swallowing.        Patient denies congestion but in light of states that she has been breathing through her mouth more often  Eyes: Negative for photophobia and visual disturbance.  Respiratory: Positive for cough. Negative for shortness of breath.        Initially  denied cough then stated that cough was minimal  Cardiovascular: Positive for chest pain (c/o central substernal chest pain). Negative for palpitations and leg swelling.  Gastrointestinal: Negative for abdominal pain and nausea.  Endocrine: Negative for polydipsia, polyphagia and polyuria.  Genitourinary: Negative for dysuria, flank pain and frequency.  Musculoskeletal: Positive for arthralgias, gait problem and myalgias.       Increased pain at site of left LE wound which started today  Skin: Positive for wound. Negative for rash.  Neurological: Negative for dizziness, light-headedness and headaches.  Hematological: Negative for adenopathy. Does not bruise/bleed easily.       Objective:    Physical Exam BP 108/80 (BP Location: Left Arm, Patient Position: Sitting, Cuff Size: Normal)   Pulse 94   Temp 98.2 F (36.8 C) (Oral)   Resp 18   Ht 5\' 11"  (1.803 m)   Wt 203 lb (92.1 kg)   SpO2 98%   BMI 28.31 kg/m Nurse's notes and vital signs reviewed General-well-nourished, well-developed female in no acute distress.  Patient smells of cigarette smoke.  Patient did not cough while I was in the exam room but patient could be heard coughing repeatedly by myself and the CMA through the exam room door. ENT- patient's TMs are obscured by impacted cerumen bilaterally, nares with mild edema/erythema of the nasal turbinates with mild clear nasal discharge, patient with posterior pharynx/tonsillar arch edema/erythema.  Patient with slightly sticky oral mucosa. Neck-supple, no lymphadenopathy, no carotid bruit Cardiovascular-regular rate and rhythm Lungs-patient with coarse breath sounds in all lung fields and decreased air movement, no increased work of breathing Abdomen-soft, nontender Extremities- patient with a compressive wrap to the left lower extremity over her wound site.  Patient does not have any significant swelling in either lower extremity.  Compressive wrap was removed by CMA for wound examination Skin- patient with continued open wound to the left lower lateral leg with granulation/proud flesh at base- pt appears to have placed a vaseline type substance over the area. Wound size if 6 cm vertical and 7 cm horizontal at it's widest areas       Assessment & Plan:  1. Chest pain, unspecified type Patient with complaint of substernal/mid chest pain.  Patient had EKG done in the office which did not show any acute T wave abnormalities per my interpretation.  Patient likely does not have cardiac origin of her chest pain.  Patient does have prior history of DVT but patient did not have any issues with acute leg pain or swelling which started around the time of her onset of chest discomfort  and shortness of breath.  Patient with low probability for PE.  Patient most likely has bronchitis as patient with abnormal lung exam and patient with recurrent coughing here in the office.  Patient also was given albuterol nebulizer treatment and had improvement in her symptoms of shortness of breath/chest discomfort.  Patient is also to obtain chest x-ray.  Again discussed with patient the need for complete smoking cessation. - EKG 12-Lead - CBC with Differential - Basic Metabolic Panel - DG Chest 2 View; Future  2. Acute bronchitis, unspecified organism Patient was given Rocephin 500 mg IM x1 here in the office for treatment of her bronchitis as well as for her open wound which is sensitive to penicillin type antibiotics.  Patient is also to obtain a chest x-ray and follow-up of her symptoms.  Patient is also being placed on a 5-day course of prednisone and prescription provided for Omnicef twice  daily x10 days.  Patient should go to the emergency department if she has acute worsening of her symptoms during non-clinic hours otherwise follow-up as needed and patient was asked to follow-up here in the clinic in approximately 2 days to make sure that she is feeling better. - cefTRIAXone (ROCEPHIN) injection 250 mg - cefTRIAXone (ROCEPHIN) injection 250 mg - DG Chest 2 View; Future - predniSONE (DELTASONE) 20 MG tablet; Take 2 pills daily for 5 days; take after eating  Dispense: 10 tablet; Refill: 0 - cefdinir (OMNICEF) 300 MG capsule; Take 1 capsule (300 mg total) by mouth 2 (two) times daily for 10 days.  Dispense: 20 capsule; Refill: 0  3. Infected open wound Patient with continued infected open wound on the left lower extremity.  Patient has so far been unable to obtain wound care due to the cost.  Patient will have CBC and sed rate at today's visit.  And patient is being placed on Omnicef. - cefTRIAXone (ROCEPHIN) injection 250 mg - cefTRIAXone (ROCEPHIN) injection 250 mg - CBC with  Differential - Sedimentation Rate - cefdinir (OMNICEF) 300 MG capsule; Take 1 capsule (300 mg total) by mouth 2 (two) times daily for 10 days.  Dispense: 20 capsule; Refill: 0  4. Pyoderma gangrenosa Patient with continued open wound to the left lower extremity which was thought to be secondary to pyoderma gangrenosa.  Patient will have BMP and sed rate at today's visit in follow-up.  Patient will also have chest x-ray to look for any changes which may be secondary to lung involvement from the pyoderma gangrenosa.  Patient is also being placed on prednisone - Basic Metabolic Panel - Sedimentation Rate - DG Chest 2 View; Future - predniSONE (DELTASONE) 20 MG tablet; Take 2 pills daily for 5 days; take after eating  Dispense: 10 tablet; Refill: 0  5. Tobacco dependence The importance of tobacco/smoking cessation was discussed with the patient to help prevent recurrent bronchitis, lung disease such as COPD/emphysema as well as the fact that her tobacco use can impair wound healing as well as contribute to peripheral vascular disease.  An After Visit Summary was printed and given to the patient.  Return in about 2 days (around 10/08/2018) for chest pain/bronchitis.

## 2018-10-07 ENCOUNTER — Telehealth: Payer: Self-pay | Admitting: Family Medicine

## 2018-10-07 LAB — CBC WITH DIFFERENTIAL/PLATELET
Basophils Absolute: 0.1 x10E3/uL (ref 0.0–0.2)
Basos: 1 %
EOS (ABSOLUTE): 0.1 x10E3/uL (ref 0.0–0.4)
Eos: 2 %
Hematocrit: 48.2 % — ABNORMAL HIGH (ref 34.0–46.6)
Hemoglobin: 16.2 g/dL — ABNORMAL HIGH (ref 11.1–15.9)
Immature Grans (Abs): 0 x10E3/uL (ref 0.0–0.1)
Immature Granulocytes: 0 %
Lymphocytes Absolute: 3.2 x10E3/uL — ABNORMAL HIGH (ref 0.7–3.1)
Lymphs: 47 %
MCH: 28.4 pg (ref 26.6–33.0)
MCHC: 33.6 g/dL (ref 31.5–35.7)
MCV: 85 fL (ref 79–97)
Monocytes Absolute: 0.6 x10E3/uL (ref 0.1–0.9)
Monocytes: 10 %
Neutrophils Absolute: 2.7 x10E3/uL (ref 1.4–7.0)
Neutrophils: 40 %
Platelets: 158 x10E3/uL (ref 150–450)
RBC: 5.7 x10E6/uL — ABNORMAL HIGH (ref 3.77–5.28)
RDW: 12.7 % (ref 12.3–15.4)
WBC: 6.7 x10E3/uL (ref 3.4–10.8)

## 2018-10-07 LAB — SEDIMENTATION RATE: Sed Rate: 85 mm/h — ABNORMAL HIGH (ref 0–40)

## 2018-10-07 LAB — BASIC METABOLIC PANEL WITH GFR
BUN/Creatinine Ratio: 21 (ref 9–23)
BUN: 24 mg/dL (ref 6–24)
CO2: 24 mmol/L (ref 20–29)
Calcium: 9.3 mg/dL (ref 8.7–10.2)
Chloride: 87 mmol/L — ABNORMAL LOW (ref 96–106)
Creatinine, Ser: 1.12 mg/dL — ABNORMAL HIGH (ref 0.57–1.00)
GFR calc Af Amer: 66 mL/min/1.73
GFR calc non Af Amer: 57 mL/min/1.73 — ABNORMAL LOW
Glucose: 138 mg/dL — ABNORMAL HIGH (ref 65–99)
Potassium: 2.7 mmol/L — ABNORMAL LOW (ref 3.5–5.2)
Sodium: 131 mmol/L — ABNORMAL LOW (ref 134–144)

## 2018-10-07 NOTE — Telephone Encounter (Signed)
Attempt was made to contact patient by phone in order to discuss abnormal lab of potassium level of 2.7.  Answer was left on voicemail for patient to go to the emergency department in follow-up of low potassium if she received a voicemail tonight.  Patient should otherwise call the office tomorrow if she does not receive the message until tomorrow and phone number for the office was left on patient's phone.  Nurse or CMA will also be notified tomorrow to attempt to contact patient to have her go to the emergency department in follow-up of her low potassium.

## 2018-10-07 NOTE — Progress Notes (Deleted)
Patient ID: Beryle BeamsDesiree Mckinney, female   DOB: [birth year], 52 y.o.   MRN: [id number]   Follow up visit after being seen in our office 10/06/2018 for LLE wound infection and cough with CP.  She was given 500mg  Rocephin IM; prescribed prednisone and omnicef and advised to f/up today.

## 2018-10-08 ENCOUNTER — Telehealth: Payer: Self-pay | Admitting: *Deleted

## 2018-10-08 ENCOUNTER — Encounter (HOSPITAL_COMMUNITY): Payer: Self-pay

## 2018-10-08 ENCOUNTER — Ambulatory Visit: Payer: Medicaid Other

## 2018-10-08 ENCOUNTER — Observation Stay (HOSPITAL_COMMUNITY)
Admission: EM | Admit: 2018-10-08 | Discharge: 2018-10-10 | Disposition: A | Discharge status: Home / Self Care | Payer: Medicaid Other | Attending: Internal Medicine | Admitting: Internal Medicine

## 2018-10-08 DIAGNOSIS — R5383 Other fatigue: Secondary | ICD-10-CM | POA: Insufficient documentation

## 2018-10-08 DIAGNOSIS — Z86718 Personal history of other venous thrombosis and embolism: Secondary | ICD-10-CM | POA: Insufficient documentation

## 2018-10-08 DIAGNOSIS — L88 Pyoderma gangrenosum: Secondary | ICD-10-CM | POA: Insufficient documentation

## 2018-10-08 DIAGNOSIS — R9431 Abnormal electrocardiogram [ECG] [EKG]: Secondary | ICD-10-CM | POA: Insufficient documentation

## 2018-10-08 DIAGNOSIS — F17210 Nicotine dependence, cigarettes, uncomplicated: Secondary | ICD-10-CM | POA: Insufficient documentation

## 2018-10-08 DIAGNOSIS — I10 Essential (primary) hypertension: Secondary | ICD-10-CM | POA: Insufficient documentation

## 2018-10-08 DIAGNOSIS — E876 Hypokalemia: Principal | ICD-10-CM | POA: Insufficient documentation

## 2018-10-08 DIAGNOSIS — S81802A Unspecified open wound, left lower leg, initial encounter: Secondary | ICD-10-CM | POA: Diagnosis present

## 2018-10-08 DIAGNOSIS — J209 Acute bronchitis, unspecified: Secondary | ICD-10-CM | POA: Insufficient documentation

## 2018-10-08 LAB — BASIC METABOLIC PANEL
ANION GAP: 12 (ref 5–15)
BUN: 21 mg/dL — ABNORMAL HIGH (ref 6–20)
CO2: 30 mmol/L (ref 22–32)
CREATININE: 1.07 mg/dL — AB (ref 0.44–1.00)
Calcium: 9 mg/dL (ref 8.9–10.3)
Chloride: 91 mmol/L — ABNORMAL LOW (ref 98–111)
GFR calc non Af Amer: 59 mL/min — ABNORMAL LOW (ref >60)
Glucose, Bld: 119 mg/dL — ABNORMAL HIGH (ref 70–99)
Potassium: 2.4 mmol/L — CL (ref 3.5–5.1)
Sodium: 133 mmol/L — ABNORMAL LOW (ref 135–145)

## 2018-10-08 LAB — CBC WITH DIFFERENTIAL/PLATELET
ABS IMMATURE GRANULOCYTES: 0.02 10*3/uL (ref 0.00–0.07)
BASOS ABS: 0 10*3/uL (ref 0.0–0.1)
Basophils Relative: 1 %
EOS PCT: 1 %
Eosinophils Absolute: 0.1 10*3/uL (ref 0.0–0.5)
HEMATOCRIT: 47.4 % — AB (ref 36.0–46.0)
HEMOGLOBIN: 15.1 g/dL — AB (ref 12.0–15.0)
IMMATURE GRANULOCYTES: 0 %
LYMPHS ABS: 3.1 10*3/uL (ref 0.7–4.0)
LYMPHS PCT: 52 %
MCH: 27.7 pg (ref 26.0–34.0)
MCHC: 31.9 g/dL (ref 30.0–36.0)
MCV: 87 fL (ref 80.0–100.0)
Monocytes Absolute: 0.6 10*3/uL (ref 0.1–1.0)
Monocytes Relative: 10 %
NEUTROS ABS: 2.2 10*3/uL (ref 1.7–7.7)
NRBC: 0 % (ref 0.0–0.2)
Neutrophils Relative %: 36 %
Platelets: 165 10*3/uL (ref 150–400)
RBC: 5.45 MIL/uL — AB (ref 3.87–5.11)
RDW: 13.1 % (ref 11.5–15.5)
WBC: 6.1 10*3/uL (ref 4.0–10.5)

## 2018-10-08 LAB — I-STAT CHEM 8, ED
BUN: 17 mg/dL (ref 6–20)
CALCIUM ION: 0.97 mmol/L — AB (ref 1.15–1.40)
CHLORIDE: 93 mmol/L — AB (ref 98–111)
CREATININE: 0.8 mg/dL (ref 0.44–1.00)
GLUCOSE: 98 mg/dL (ref 70–99)
HCT: 40 % (ref 36.0–46.0)
Hemoglobin: 13.6 g/dL (ref 12.0–15.0)
Potassium: 2.6 mmol/L — CL (ref 3.5–5.1)
Sodium: 134 mmol/L — ABNORMAL LOW (ref 135–145)
TCO2: 33 mmol/L — AB (ref 22–32)

## 2018-10-08 MED ORDER — SODIUM CHLORIDE 0.9 % IV BOLUS
1000.0000 mL | Freq: Once | INTRAVENOUS | Status: AC
  Administered 2018-10-08: 1000 mL via INTRAVENOUS

## 2018-10-08 MED ORDER — POTASSIUM CHLORIDE CRYS ER 20 MEQ PO TBCR
60.0000 meq | EXTENDED_RELEASE_TABLET | Freq: Once | ORAL | Status: AC
  Administered 2018-10-08: 60 meq via ORAL
  Filled 2018-10-08: qty 3

## 2018-10-08 MED ORDER — POTASSIUM CHLORIDE 10 MEQ/100ML IV SOLN
10.0000 meq | INTRAVENOUS | Status: AC
  Administered 2018-10-08 – 2018-10-09 (×6): 10 meq via INTRAVENOUS
  Filled 2018-10-08 (×5): qty 100

## 2018-10-08 MED ORDER — POTASSIUM CHLORIDE IN NACL 20-0.9 MEQ/L-% IV SOLN
Freq: Once | INTRAVENOUS | Status: AC
  Administered 2018-10-08: 19:00:00 via INTRAVENOUS
  Filled 2018-10-08: qty 1000

## 2018-10-08 NOTE — ED Triage Notes (Signed)
Pt. Coming from home. Pt. Was seen by [facility] and was called and told her potassium level was low. Pt. Has been feeling weakness and has been having some chest pain. Pt. Denies cp currently. Pt. Also denies n/v/d. A/O X4

## 2018-10-08 NOTE — Telephone Encounter (Signed)
Left message on voicemail to return call. Unable to reach.  Pt reminded of appointment today on voicemail in addition.   Notes recorded by Cain SaupeFulp, Cammie, MD on 10/07/2018 at 8:01 PM EST Patient with low potassium at 2.7 and phone message was left for patient to go to the emergency department in follow-up of her low potassium if she receives a phone call tonight otherwise, patient should call the office tomorrow. Please also attempt to call patient and have her go to the emergency department in follow-up of her low potassium of 2.7. Patient is scheduled for office visit tomorrow however this visit is not until the late afternoon and patient needs to go to the emergency department for recheck of potassium and potassium replacement if potassium level is still critically low. Patient's glucose was also slightly elevated and patient will need hemoglobin A1c at her next visit if this has not already been done within the past 90 days. Patient sedimentation rate was elevated at 85. At her appointment, we will discuss referral to rheumatology in follow-up of her pyoderma gangrenosa.

## 2018-10-08 NOTE — ED Provider Notes (Signed)
[facility] EMERGENCY DEPARTMENT Provider Note   CSN: [id number] Arrival date & time: 10/08/18  1623     History   Chief Complaint No chief complaint on file.   HPI Joeann Steppe is a 52 y.o. female.  HPI  Patient with history of hypertension, and pyoderma gangrenosum presents with concern of weakness, nausea, fatigue. Onset of this illness was about 1 week ago, since onset symptoms have been persistent. Is morning during evaluation she was found to have low potassium value and was sent here for evaluation. She denies vomiting, diarrhea, hematemesis, melena, urinary complaints, vaginal bleeding, or bleeding from anywhere. She notes that in addition to her hydrochlorothiazide, she is also currently taking Augmentin, and prednisone for an open wound on her left lateral calf, which she states is healing generally well.   Past Medical History:  Diagnosis Date  . DVT (deep venous thrombosis) (HCC) 2016  . Hypertension     There are no active problems to display for this patient.   Past Surgical History:  Procedure Laterality Date  . CESAREAN SECTION     x5  . TUBAL LIGATION       OB History   None      Home Medications    Prior to Admission medications   Medication Sig Start Date End Date Taking? Authorizing Provider  amLODipine (NORVASC) 5 MG tablet Take 1 tablet (5 mg total) by mouth daily. 09/22/18   Fulp, Cammie, MD  cefdinir (OMNICEF) 300 MG capsule Take 1 capsule (300 mg total) by mouth 2 (two) times daily for 10 days. 10/06/18 10/16/18  Fulp, Cammie, MD  hydrochlorothiazide (HYDRODIURIL) 25 MG tablet Take 1 tablet (25 mg total) by mouth daily. 09/22/18 12/21/18  Fulp, Hewitt Shorts, MD  predniSONE (DELTASONE) 20 MG tablet Take 2 pills daily for 5 days; take after eating 10/06/18   Fulp, Cammie, MD  traMADol (ULTRAM) 50 MG tablet Take 1 tablet (50 mg total) by mouth every 8 (eight) hours as needed for moderate pain. 30 day supply; no early refill  09/22/18 10/22/18  Fulp, Hewitt Shorts, MD  Zinc Oxide (TRIPLE PASTE) 12.8 % ointment Apply 1 application topically as needed for irritation. 07/17/18   Claiborne Rigg, NP    Family History Family History  Problem Relation Age of Onset  . Hypertension Mother   . Diabetes Mother     Social History Social History   Tobacco Use  . Smoking status: Current Every Day Smoker    Packs/day: 0.50    Types: Cigarettes  . Smokeless tobacco: Never Used  Substance Use Topics  . Alcohol use: Not Currently    Comment: once-twice weeks.   . Drug use: Yes    Types: Marijuana     Allergies   Patient has no known allergies.   Review of Systems Review of Systems  Constitutional:       Per HPI, otherwise negative  HENT:       Per HPI, otherwise negative  Respiratory:       Per HPI, otherwise negative  Cardiovascular:       Per HPI, otherwise negative  Gastrointestinal: Positive for nausea. Negative for vomiting.  Endocrine:       Negative aside from HPI  Genitourinary:       Neg aside from HPI   Musculoskeletal:       Per HPI, otherwise negative  Skin: Positive for wound.  Allergic/Immunologic: Positive for immunocompromised state.  Neurological: Positive for weakness. Negative for syncope.  Physical Exam Updated Vital Signs BP 129/76   Pulse 81   Temp 99.1 F (37.3 C) (Oral)   Resp 14   Ht 5\' 11"  (1.803 m)   Wt 96.6 kg   SpO2 98%   BMI 29.71 kg/m   Physical Exam  Constitutional: She is oriented to person, place, and time. She appears well-developed and well-nourished. No distress.  HENT:  Head: Normocephalic and atraumatic.  Eyes: Conjunctivae and EOM are normal.  Cardiovascular: Normal rate and regular rhythm.  Pulmonary/Chest: Effort normal and breath sounds normal. No stridor. No respiratory distress.  Abdominal: She exhibits no distension.  Musculoskeletal: She exhibits no edema.  Neurological: She is alert and oriented to person, place, and time. No cranial  nerve deficit.  Skin: Skin is warm and dry.     Psychiatric: She has a normal mood and affect.  Nursing note and vitals reviewed.    ED Treatments / Results  Labs (all labs ordered are listed, but only abnormal results are displayed) Labs Reviewed  BASIC METABOLIC PANEL - Abnormal; Notable for the following components:      Result Value   Sodium 133 (*)    Potassium 2.4 (*)    Chloride 91 (*)    Glucose, Bld 119 (*)    BUN 21 (*)    Creatinine, Ser 1.07 (*)    GFR calc non Af Amer 59 (*)    All other components within normal limits  CBC WITH DIFFERENTIAL/PLATELET - Abnormal; Notable for the following components:   RBC 5.45 (*)    Hemoglobin 15.1 (*)    HCT 47.4 (*)    All other components within normal limits  I-STAT CHEM 8, ED - Abnormal; Notable for the following components:   Sodium 134 (*)    Potassium 2.6 (*)    Chloride 93 (*)    Calcium, Ion 0.97 (*)    TCO2 33 (*)    All other components within normal limits    EKG EKG Interpretation  Date/Time:  Thursday October 08 2018 16:33:58 EST Ventricular Rate:  77 PR Interval:    QRS Duration: 91 QT Interval:  401 QTC Calculation: 454 R Axis:   40 Text Interpretation:  Sinus rhythm ST-t wave abnormality Abnormal ekg Confirmed by Gerhard Munch [id number]) on 10/08/2018 4:37:21 PM  Procedures Procedures (including critical care time)  Medications Ordered in ED Medications  potassium chloride 10 mEq in 100 mL IVPB (has no administration in time range)  sodium chloride 0.9 % bolus 1,000 mL (0 mLs Intravenous Stopped 10/08/18 1842)  0.9 % NaCl with KCl 20 mEq/ L  infusion ( Intravenous New Bag/Given 10/08/18 1900)  potassium chloride SA (K-DUR,KLOR-CON) CR tablet 60 mEq (60 mEq Oral Given 10/08/18 1845)     Initial Impression / Assessment and Plan / ED Course  I have reviewed the triage vital signs and the nursing notes.  Pertinent labs & imaging results that were available during my care of the patient were  reviewed by me and considered in my medical decision making (see chart for details).     10:57 PM Patient complains of fatigue, otherwise without complaints.  10:57 PM After IV potassium, oral potassium, potassium value is only increased minimally. Patient continues to complain of fatigue Given concern for persistent hypokalemia, weakness, the patient will be admitted for further IV repletion, repeat evaluation. Absent other complaints, including fever, low suspicion for worsening of her lower extremity wound, no evidence for cellulitis either. Hypokalemia may be secondary to medications,  with chronic use of hydrochlorothiazide, and new steroids, Augmentin.     Final Clinical Impressions(s) / ED Diagnoses  Hypokalemia   Gerhard MunchLockwood, Agatha Duplechain, MD 10/08/18 2258

## 2018-10-09 ENCOUNTER — Other Ambulatory Visit: Payer: Self-pay

## 2018-10-09 ENCOUNTER — Encounter (HOSPITAL_COMMUNITY): Payer: Self-pay | Admitting: Internal Medicine

## 2018-10-09 DIAGNOSIS — I10 Essential (primary) hypertension: Secondary | ICD-10-CM | POA: Diagnosis present

## 2018-10-09 DIAGNOSIS — E876 Hypokalemia: Secondary | ICD-10-CM

## 2018-10-09 DIAGNOSIS — S81802A Unspecified open wound, left lower leg, initial encounter: Secondary | ICD-10-CM | POA: Diagnosis present

## 2018-10-09 DIAGNOSIS — J209 Acute bronchitis, unspecified: Secondary | ICD-10-CM

## 2018-10-09 LAB — CBC
HEMATOCRIT: 42.3 % (ref 36.0–46.0)
HEMOGLOBIN: 13.5 g/dL (ref 12.0–15.0)
MCH: 28 pg (ref 26.0–34.0)
MCHC: 31.9 g/dL (ref 30.0–36.0)
MCV: 87.6 fL (ref 80.0–100.0)
Platelets: 119 10*3/uL — ABNORMAL LOW (ref 150–400)
RBC: 4.83 MIL/uL (ref 3.87–5.11)
RDW: 13.2 % (ref 11.5–15.5)
WBC: 5.2 10*3/uL (ref 4.0–10.5)
nRBC: 0 % (ref 0.0–0.2)

## 2018-10-09 LAB — BASIC METABOLIC PANEL
ANION GAP: 8 (ref 5–15)
BUN: 11 mg/dL (ref 6–20)
CHLORIDE: 100 mmol/L (ref 98–111)
CO2: 28 mmol/L (ref 22–32)
Calcium: 8.3 mg/dL — ABNORMAL LOW (ref 8.9–10.3)
Creatinine, Ser: 0.85 mg/dL (ref 0.44–1.00)
GFR calc Af Amer: >60 mL/min (ref >60)
GFR calc non Af Amer: >60 mL/min (ref >60)
GLUCOSE: 127 mg/dL — AB (ref 70–99)
POTASSIUM: 2.8 mmol/L — AB (ref 3.5–5.1)
Sodium: 136 mmol/L (ref 135–145)

## 2018-10-09 LAB — BASIC METABOLIC PANEL WITH GFR
Anion gap: 10 (ref 5–15)
BUN: 8 mg/dL (ref 6–20)
CO2: 26 mmol/L (ref 22–32)
Calcium: 8.4 mg/dL — ABNORMAL LOW (ref 8.9–10.3)
Chloride: 100 mmol/L (ref 98–111)
Creatinine, Ser: 0.83 mg/dL (ref 0.44–1.00)
GFR calc Af Amer: >60 mL/min
GFR calc non Af Amer: >60 mL/min
Glucose, Bld: 100 mg/dL — ABNORMAL HIGH (ref 70–99)
Potassium: 3 mmol/L — ABNORMAL LOW (ref 3.5–5.1)
Sodium: 136 mmol/L (ref 135–145)

## 2018-10-09 LAB — MAGNESIUM: Magnesium: 2.1 mg/dL (ref 1.7–2.4)

## 2018-10-09 LAB — HIV ANTIBODY (ROUTINE TESTING W REFLEX): HIV Screen 4th Generation wRfx: NONREACTIVE

## 2018-10-09 MED ORDER — POTASSIUM CHLORIDE CRYS ER 20 MEQ PO TBCR
40.0000 meq | EXTENDED_RELEASE_TABLET | Freq: Two times a day (BID) | ORAL | Status: DC
  Administered 2018-10-10: 40 meq via ORAL
  Filled 2018-10-09: qty 2

## 2018-10-09 MED ORDER — ACETAMINOPHEN 325 MG PO TABS: 650.0000 mg | ORAL_TABLET | Freq: Four times a day (QID) | ORAL | Status: DC | PRN

## 2018-10-09 MED ORDER — IPRATROPIUM-ALBUTEROL 0.5-2.5 (3) MG/3ML IN SOLN
3.0000 mL | Freq: Four times a day (QID) | RESPIRATORY_TRACT | Status: DC
  Administered 2018-10-09: 3 mL via RESPIRATORY_TRACT
  Filled 2018-10-09: qty 3

## 2018-10-09 MED ORDER — ONDANSETRON HCL 4 MG PO TABS: 4.0000 mg | ORAL_TABLET | Freq: Four times a day (QID) | ORAL | Status: DC | PRN

## 2018-10-09 MED ORDER — POTASSIUM CHLORIDE CRYS ER 20 MEQ PO TBCR
40.0000 meq | EXTENDED_RELEASE_TABLET | ORAL | Status: AC
  Administered 2018-10-09 (×2): 40 meq via ORAL
  Filled 2018-10-09 (×2): qty 2

## 2018-10-09 MED ORDER — BUDESONIDE 0.25 MG/2ML IN SUSP
0.2500 mg | Freq: Two times a day (BID) | RESPIRATORY_TRACT | Status: DC
  Administered 2018-10-09 – 2018-10-10 (×2): 0.25 mg via RESPIRATORY_TRACT
  Filled 2018-10-09 (×3): qty 2

## 2018-10-09 MED ORDER — AMLODIPINE BESYLATE 5 MG PO TABS
5.0000 mg | ORAL_TABLET | Freq: Every day | ORAL | Status: DC
  Administered 2018-10-09: 5 mg via ORAL
  Filled 2018-10-09: qty 1

## 2018-10-09 MED ORDER — ONDANSETRON HCL 4 MG/2ML IJ SOLN: 4.0000 mg | Freq: Four times a day (QID) | INTRAMUSCULAR | Status: DC | PRN

## 2018-10-09 MED ORDER — CEFDINIR 300 MG PO CAPS
300.0000 mg | ORAL_CAPSULE | Freq: Two times a day (BID) | ORAL | Status: DC
  Administered 2018-10-09 – 2018-10-10 (×3): 300 mg via ORAL
  Filled 2018-10-09 (×3): qty 1

## 2018-10-09 MED ORDER — ZINC OXIDE 12.8 % EX OINT
1.0000 "application " | TOPICAL_OINTMENT | CUTANEOUS | Status: DC | PRN
  Filled 2018-10-09: qty 56.7

## 2018-10-09 MED ORDER — ACETAMINOPHEN 650 MG RE SUPP: 650.0000 mg | Freq: Four times a day (QID) | RECTAL | Status: DC | PRN

## 2018-10-09 MED ORDER — IPRATROPIUM-ALBUTEROL 0.5-2.5 (3) MG/3ML IN SOLN
3.0000 mL | Freq: Two times a day (BID) | RESPIRATORY_TRACT | Status: DC
  Administered 2018-10-10: 3 mL via RESPIRATORY_TRACT
  Filled 2018-10-09 (×2): qty 3

## 2018-10-09 MED ORDER — POTASSIUM CHLORIDE CRYS ER 20 MEQ PO TBCR
40.0000 meq | EXTENDED_RELEASE_TABLET | ORAL | Status: AC
  Administered 2018-10-09 (×2): 40 meq via ORAL
  Filled 2018-10-09 (×3): qty 2

## 2018-10-09 MED ORDER — TRAMADOL HCL 50 MG PO TABS
50.0000 mg | ORAL_TABLET | Freq: Four times a day (QID) | ORAL | Status: DC | PRN
  Administered 2018-10-09: 50 mg via ORAL
  Filled 2018-10-09: qty 1

## 2018-10-09 MED ORDER — ENOXAPARIN SODIUM 40 MG/0.4ML ~~LOC~~ SOLN
40.0000 mg | SUBCUTANEOUS | Status: DC
  Administered 2018-10-09 – 2018-10-10 (×2): 40 mg via SUBCUTANEOUS
  Filled 2018-10-09 (×2): qty 0.4

## 2018-10-09 NOTE — Progress Notes (Signed)
[facility] PLAN OF CARE NOTE Patient: Beryle BeamsDesiree Bordenave QIH:[id number]:[id number]   PCP: Cain SaupeFulp, Cammie, MD DOB: 01/21/1966   DOA: 10/08/2018   DOS: 10/09/2018    Patient was admitted by my colleague Dr. Toniann FailKakrakandy  earlier on 10/09/2018. I have reviewed the H&P as well as assessment and plan and agree with the same. Important changes in the plan are listed below.  Plan of care: Principal Problem:   Hypokalemia Active Problems:   Essential hypertension   Leg wound, left   Acute bronchitis Potassium still 3.0.  Will replace it again in recheck it tomorrow morning.  Author: Lynden OxfordPranav Brysyn Brandenberger, MD [facility] Pager: [phone] 10/09/2018 5:21 PM   If 7PM-7AM, please contact night-coverage at [email or web address], password [email or web address]

## 2018-10-09 NOTE — H&P (Signed)
History and Physical    Cheryl Mckinney WUJ:[id number] DOB: [birth year] DOA: 10/08/2018  PCP: Cain Saupe, MD  Patient coming from: Home.  Chief Complaint: Low potassium.  HPI: Cheryl Mckinney is a 52 y.o. 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  Had that visit patient had labs drawn which showed potassium of 2.7 and was instructed to come to the ER.  Patient denies any nausea vomiting diarrhea.  Takes hydrochlorothiazide for hypertension.  ED Course: In the ER repeat labs show potassium of 2.4..  And was given 60 p.o. potassium chloride and 20 of IV potassium run following which potassium only increased to 2.6.  EKG shows normal sinus rhythm with some U waves and given the changes patient has been admitted for further observation and correction of potassium.  Review of Systems: As per HPI, rest all negative.   Past Medical History:  Diagnosis Date  . DVT (deep venous thrombosis) (HCC) 2016  . Hypertension     Past Surgical History:  Procedure Laterality Date  . CESAREAN SECTION     x5  . TUBAL LIGATION       reports that she has been smoking cigarettes. She has been smoking about 0.50 packs per day. She has never used smokeless tobacco. She reports that she drank alcohol. She reports that she has current or past drug history. Drug: Marijuana.  No Known Allergies  Family History  Problem Relation Age of Onset  . Hypertension Mother   . Diabetes Mother     Prior to Admission medications   Medication Sig Start Date End Date Taking? Authorizing Provider  amLODipine (NORVASC) 5 MG tablet Take 1 tablet (5 mg total) by mouth daily. 09/22/18  Yes Fulp, Cammie, MD  cefdinir  (OMNICEF) 300 MG capsule Take 1 capsule (300 mg total) by mouth 2 (two) times daily for 10 days. 10/06/18 10/16/18 Yes Fulp, Cammie, MD  hydrochlorothiazide (HYDRODIURIL) 25 MG tablet Take 1 tablet (25 mg total) by mouth daily. 09/22/18 12/21/18 Yes Fulp, Cammie, MD  traMADol (ULTRAM) 50 MG tablet Take 1 tablet (50 mg total) by mouth every 8 (eight) hours as needed for moderate pain. 30 day supply; no early refill 09/22/18 10/22/18 Yes Fulp, Cammie, MD  Zinc Oxide (TRIPLE PASTE) 12.8 % ointment Apply 1 application topically as needed for irritation. 07/17/18  Yes Claiborne Rigg, NP  predniSONE (DELTASONE) 20 MG tablet Take 2 pills daily for 5 days; take after eating Patient not taking: Reported on 10/08/2018 10/06/18   Cain Saupe, MD    Physical Exam: Vitals:   10/08/18 1700 10/08/18 1903 10/08/18 2331 10/09/18 0128  BP: (!) 144/97 129/76 (!) 122/94 123/80  Pulse: 80 81 64   Resp: (!) 21 14 16 16   Temp:    98.9 F (37.2 C)  TempSrc:    Oral  SpO2: 95% 98% 97% 98%  Weight:    96.5 kg  Height:    5\' 11"  (1.803 m)      Constitutional: Moderately built and nourished. Vitals:   10/08/18 1700 10/08/18 1903 10/08/18 2331 10/09/18 0128  BP: (!) 144/97 129/76 (!) 122/94 123/80  Pulse: 80 81 64   Resp: (!) 21 14 16 16   Temp:  98.9 F (37.2 C)  TempSrc:    Oral  SpO2: 95% 98% 97% 98%  Weight:    96.5 kg  Height:    5\' 11"  (1.803 m)   Eyes: Anicteric no pallor. ENMT: No discharge from the ears eyes nose or mouth. Neck: No mass or.  No neck rigidity. Respiratory: Bilateral expiratory wheeze and no crepitations. Cardiovascular: S1-S2 heard no murmurs appreciated. Abdomen: Soft nontender bowel sounds present. Musculoskeletal: Wound on the left leg no active discharge seen. Skin: Wound on the left leg with no active discharge seen.  It measures around 10 cm in diameter. Neurologic: Alert awake oriented to time place and person.  Moves all extremities. Psychiatric: Appears  normal.   Labs on Admission: I have personally reviewed following labs and imaging studies  CBC: Recent Labs  Lab 10/06/18 0920 10/08/18 1657 10/08/18 2221  WBC 6.7 6.1  --   NEUTROABS 2.7 2.2  --   HGB 16.2* 15.1* 13.6  HCT 48.2* 47.4* 40.0  MCV 85 87.0  --   PLT 158 165  --    Basic Metabolic Panel: Recent Labs  Lab 10/06/18 0920 10/08/18 1657 10/08/18 2221  NA 131* 133* 134*  K 2.7* 2.4* 2.6*  CL 87* 91* 93*  CO2 24 30  --   GLUCOSE 138* 119* 98  BUN 24 21* 17  CREATININE 1.12* 1.07* 0.80  CALCIUM 9.3 9.0  --    GFR: Estimated Creatinine Clearance: 107.7 mL/min (by C-G formula based on SCr of 0.8 mg/dL). Liver Function Tests: No results for input(s): AST, ALT, ALKPHOS, BILITOT, PROT, ALBUMIN in the last 168 hours. No results for input(s): LIPASE, AMYLASE in the last 168 hours. No results for input(s): AMMONIA in the last 168 hours. Coagulation Profile: No results for input(s): INR, PROTIME in the last 168 hours. Cardiac Enzymes: No results for input(s): CKTOTAL, CKMB, CKMBINDEX, TROPONINI in the last 168 hours. BNP (last 3 results) No results for input(s): PROBNP in the last 8760 hours. HbA1C: No results for input(s): HGBA1C in the last 72 hours. CBG: No results for input(s): GLUCAP in the last 168 hours. Lipid Profile: No results for input(s): CHOL, HDL, LDLCALC, TRIG, CHOLHDL, LDLDIRECT in the last 72 hours. Thyroid Function Tests: No results for input(s): TSH, T4TOTAL, FREET4, T3FREE, THYROIDAB in the last 72 hours. Anemia Panel: No results for input(s): VITAMINB12, FOLATE, FERRITIN, TIBC, IRON, RETICCTPCT in the last 72 hours. Urine analysis: No results found for: COLORURINE, APPEARANCEUR, LABSPEC, PHURINE, GLUCOSEU, HGBUR, BILIRUBINUR, KETONESUR, PROTEINUR, UROBILINOGEN, NITRITE, LEUKOCYTESUR Sepsis Labs: @LABRCNTIP (procalcitonin:4,lacticidven:4) )No results found for this or any previous visit (from the past 240 hour(s)).   Radiological Exams on  Admission: No results found.  EKG: Independently reviewed.  Normal sinus rhythm with Q waves.  QRS 91 ms QTc 454 ms.  Assessment/Plan Principal Problem:   Hypokalemia Active Problems:   Essential hypertension   Leg wound, left   Acute bronchitis    1. Severe hypokalemia -with replacement patient is still hypokalemic and with EKG changes will admit for further observation.  6 more KCl 10 mEq IV has been ordered and patient has already received 60 mEq p.o. and also 20 mg IV previously.  Check magnesium levels.  Hold hydrochlorothiazide probably contributing to patient's hypokalemia. 2. Acute bronchitis -on Omnicef.  Still wheezing but not hypoxic.  Continue with nebulizer treatment.  Just finished a course of prednisone.  Will add Pulmicort. 3. Chronic wound of the left lower extremity from pyoderma gangrenosum per primary care physician.  Patient feels her wound is improved after her recent antibiotics and prednisone.  Wound team consult requested. 4. Hypertension -we will hold off patient's hydrochlorothiazide due to severe hypokalemia for now.  Continue lisinopril.   DVT prophylaxis: Lovenox. Code Status: Full code. Family Communication: Discussed with patient. Disposition Plan: Home. Consults called: Wound team. Admission status: Observation.   Eduard ClosArshad N Kakrakandy MD [facility] Pager [phone].  If 7PM-7AM, please contact night-coverage [email or web address] Password [email or web address]  10/09/2018, 3:28 AM

## 2018-10-09 NOTE — Consult Note (Signed)
WOC Nurse wound consult note Reason for Consult: Consult requested for left leg; pt states she has Pyoderma which has been present several months, and she is using Vaseline gauze.  Wound type: Chronic full thickness wound to left outer leg Measurement: 7X7X.2cm, irregular shaped Wound bed: red and moist, 15% yellow slough, 85% red Drainage (amount, consistency, odor) mod amt tan drainage, no odor Periwound: intact skin surrounding Dressing procedure/placement/frequency: Continue with present plan of care with Vaseline gauze to promote moist healing, and foam dressing to protect from further injury and absorb drainage. Discussed plan of care with patient and she appears to be well-informed regarding this diagnosis. Please re-consult if further assistance is needed.  Thank-you,  Cammie Mcgeeawn Emmalyne Giacomo MSN, RN, CWOCN, AddisonWCN-AP, CNS [phone]

## 2018-10-10 LAB — BASIC METABOLIC PANEL
ANION GAP: 6 (ref 5–15)
BUN: 9 mg/dL (ref 6–20)
CO2: 27 mmol/L (ref 22–32)
Calcium: 8.5 mg/dL — ABNORMAL LOW (ref 8.9–10.3)
Chloride: 103 mmol/L (ref 98–111)
Creatinine, Ser: 0.79 mg/dL (ref 0.44–1.00)
GFR calc Af Amer: >60 mL/min (ref >60)
GLUCOSE: 103 mg/dL — AB (ref 70–99)
POTASSIUM: 3.4 mmol/L — AB (ref 3.5–5.1)
Sodium: 136 mmol/L (ref 135–145)

## 2018-10-10 LAB — CBC
HEMATOCRIT: 38.8 % (ref 36.0–46.0)
Hemoglobin: 12.5 g/dL (ref 12.0–15.0)
MCH: 28 pg (ref 26.0–34.0)
MCHC: 32.2 g/dL (ref 30.0–36.0)
MCV: 87 fL (ref 80.0–100.0)
Platelets: 129 10*3/uL — ABNORMAL LOW (ref 150–400)
RBC: 4.46 MIL/uL (ref 3.87–5.11)
RDW: 13.3 % (ref 11.5–15.5)
WBC: 4.7 10*3/uL (ref 4.0–10.5)
nRBC: 0 % (ref 0.0–0.2)

## 2018-10-10 LAB — MAGNESIUM: Magnesium: 1.9 mg/dL (ref 1.7–2.4)

## 2018-10-10 MED ORDER — POTASSIUM CHLORIDE CRYS ER 20 MEQ PO TBCR: 20.0000 meq | EXTENDED_RELEASE_TABLET | Freq: Every day | ORAL | 0 refills | Status: DC

## 2018-10-10 NOTE — Discharge Summary (Signed)
[facility] Discharge Summary   Patient: Cheryl Mckinney ZOX:[id number]   PCP: Cain Saupe, MD DOB: 03-Jun-1966   Date of admission: 10/08/2018   Date of discharge:  10/10/2018    Discharge Diagnoses:  Principal Problem:   Hypokalemia Active Problems:   Essential hypertension   Leg wound, left   Acute bronchitis  Admitted From: Home Disposition: Home  Recommendations for Outpatient Follow-up:  1. Please follow-up with PCP in 1 week. 2. Patient will need a repeat BMP in 1 week as well as PCP will need to decide whether antihypertensive medicines needs to be resumed or not. 3. With hypokalemia and hypertension patient may require work-up for primary hyperaldosteronism.  Follow-up Information    Fulp, Cammie, MD. Schedule an appointment as soon as possible for a visit in 1 week(s).   Specialty:  Family Medicine Contact information: [address] [place] [address] [phone]          Diet recommendation: Cardiac diet  Activity: The patient is advised to gradually reintroduce usual activities.  Discharge Condition: good  Code Status: full code  History of present illness: As per the H and P dictated on admission, "Cheryl Mckinney is a 52 y.o. 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  Had that visit patient had labs drawn which showed potassium of 2.7 and was instructed to come to the ER.  Patient denies any nausea vomiting diarrhea.  Takes hydrochlorothiazide for hypertension.  ED Course: In the ER repeat labs show potassium of 2.4..  And was given 60 p.o. potassium chloride and 20 of IV potassium run following which potassium only increased to 2.6.  EKG shows  normal sinus rhythm with some U waves and given the changes patient has been admitted for further observation and correction of potassium."  Hospital Course:  Summary of her active problems in the hospital is as following. 1. Severe hypokalemia Presents with fatigue. Found to have hypokalemia with replacement patient is still hypokalemic and with EKG changes patient was admitted for further observation.   Potassium was aggressively replaced with IV as well as oral replacement. On the day of the discharge potassium is 3.4. Magnesium stable. Suspect this is secondary to hydrochlorothiazide. Currently holding that medicine on discharge. Continuing 20 mg of potassium for 7 more days. Recommend patient to follow-up with PCP with repeat BMP.  2. Acute bronchitis  on Omnicef.    Complete the course. No acute abnormality. Breathing okay.  3. Chronic wound of the left lower extremity from pyoderma gangrenosum per primary care physician.   Patient feels her wound is improved after her recent antibiotics and prednisone.   Wound team consult requested.  4. Hypertension  Blood pressure soft, orthostatics negative. Patient blood pressure medications were on hold on the hospital. We will continue to hold them at home. Recommend follow-up with PCP regarding initiation of the blood pressure medications. With hypokalemia as well as hypertension patient may have primary hyperaldosteronism and would benefit from further work-up outpatient.  All other chronic medical condition were stable during the hospitalization.  Patient was ambulatory without any assistance. On the day of the discharge the patient's vitals were stable , and no other acute medical condition were reported by patient.  the patient was felt safe to be discharge at home with family.  Consultants: none Procedures: none  DISCHARGE MEDICATION: Allergies as of 10/10/2018   No Known Allergies     Medication List    STOP taking  these medications   amLODipine 5 MG tablet Commonly known as:  NORVASC   hydrochlorothiazide 25 MG tablet Commonly known as:  HYDRODIURIL   predniSONE 20 MG tablet Commonly known as:  DELTASONE     TAKE these medications   cefdinir 300 MG capsule Commonly known as:  OMNICEF Take 1 capsule (300 mg total) by mouth 2 (two) times daily for 10 days.   potassium chloride SA 20 MEQ tablet Commonly known as:  K-DUR,KLOR-CON Take 1 tablet (20 mEq total) by mouth daily.   traMADol 50 MG tablet Commonly known as:  ULTRAM Take 1 tablet (50 mg total) by mouth every 8 (eight) hours as needed for moderate pain. 30 day supply; no early refill   Zinc Oxide 12.8 % ointment Commonly known as:  TRIPLE PASTE Apply 1 application topically as needed for irritation.      No Known Allergies Discharge Instructions    Diet - low sodium heart healthy   Complete by:  As directed    Discharge instructions   Complete by:  As directed    It is important that you read following instructions as well as go over your medication list with RN to help you understand your care after this hospitalization.  Discharge Instructions: Please follow-up with PCP in one week  Please request your primary care physician to go over all Hospital Tests and Procedure/Radiological results at the follow up,  Please get all Hospital records sent to your PCP by signing hospital release before you go home.   Do not take more than prescribed Pain, Sleep and Anxiety Medications. You were cared for by a hospitalist during your hospital stay. If you have any questions about your discharge medications or the care you received while you were in the hospital after you are discharged, you can call the unit you were admitted to and ask to speak with the hospitalist on call if the hospitalist that took care of you is not available.  Once you are discharged, your primary care physician will handle any further medical issues. Please note  that NO REFILLS for any discharge medications will be authorized once you are discharged, as it is imperative that you return to your primary care physician (or establish a relationship with a primary care physician if you do not have one) for your aftercare needs so that they can reassess your need for medications and monitor your lab values. You Must read complete instructions/literature along with all the possible adverse reactions/side effects for all the Medicines you take and that have been prescribed to you. Take any new Medicines after you have completely understood and accept all the possible adverse reactions/side effects. Wear Seat belts while driving. If you have smoked or chewed Tobacco in the last 2 yrs please stop smoking and/or stop any Recreational drug use.   Increase activity slowly   Complete by:  As directed      Discharge Exam: Filed Weights   10/08/18 1631 10/09/18 0128  Weight: 96.6 kg 96.5 kg   Vitals:   10/10/18 0722 10/10/18 0758  BP: (!) 105/55   Pulse: 61   Resp: 16   Temp: 98.8 F (37.1 C)   SpO2: 100% 100%   General: Appear in no distress, no Rash;  Oral Mucosa moist. Cardiovascular: S1 and S2 Present, no Murmur, no JVD Respiratory: Bilateral Air entry present and Clear to Auscultation, no Crackles, no wheezes Abdomen: Bowel Sound present, Soft and no tenderness Extremities: no Pedal edema, no calf tenderness Neurology: Grossly no focal neuro deficit.  The results of significant diagnostics from this hospitalization (including imaging, microbiology, ancillary and laboratory) are listed below for reference.    Significant Diagnostic Studies: No results found.  Microbiology: No results found for this or any previous visit (from the past 240 hour(s)).   Labs: CBC: Recent Labs  Lab 10/06/18 0920 10/08/18 1657 10/08/18 2221 10/09/18 0656 10/10/18 0252  WBC 6.7 6.1  --  5.2 4.7  NEUTROABS 2.7 2.2  --   --   --   HGB 16.2* 15.1* 13.6 13.5 12.5    HCT 48.2* 47.4* 40.0 42.3 38.8  MCV 85 87.0  --  87.6 87.0  PLT 158 165  --  119* 129*   Basic Metabolic Panel: Recent Labs  Lab 10/06/18 0920 10/08/18 1657 10/08/18 2221 10/09/18 0656 10/09/18 1357 10/10/18 0252  NA 131* 133* 134* 136 136 136  K 2.7* 2.4* 2.6* 2.8* 3.0* 3.4*  CL 87* 91* 93* 100 100 103  CO2 24 30  --  28 26 27   GLUCOSE 138* 119* 98 127* 100* 103*  BUN 24 21* 17 11 8 9   CREATININE 1.12* 1.07* 0.80 0.85 0.83 0.79  CALCIUM 9.3 9.0  --  8.3* 8.4* 8.5*  MG  --   --   --  2.1  --  1.9   Liver Function Tests: No results for input(s): AST, ALT, ALKPHOS, BILITOT, PROT, ALBUMIN in the last 168 hours. No results for input(s): LIPASE, AMYLASE in the last 168 hours. No results for input(s): AMMONIA in the last 168 hours. Cardiac Enzymes: No results for input(s): CKTOTAL, CKMB, CKMBINDEX, TROPONINI in the last 168 hours. BNP (last 3 results) No results for input(s): BNP in the last 8760 hours. CBG: No results for input(s): GLUCAP in the last 168 hours. Time spent: 35 minutes  Signed:  Lynden Oxford  [facility]  10/10/2018  , 11:44 AM

## 2018-10-28 NOTE — Progress Notes (Signed)
Patient ID: Cheryl BeamsDesiree Mckinney, female   DOB: 08/26/1966, 52 y.o.   MRN: [id number]     Cheryl Mckinney, is a 52 y.o. female  WUJ:[id number]  NFA:[id number]  DOB - 08/26/1966  Subjective:  Chief Complaint and HPI: Cheryl Mckinney is a 52 y.o. female here today for a follow up visit After hospitalization 11/21-11/23.  She had severe hypokalemia.  All BP meds were stopped.  She feels ok other than the leg pain(see previous charts).  She has been unable to go to wound care clinic due to cost and hasn't gathered information for orange card.  No fever.  Wound still not closed and still gets swelling at the end of her shift.    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 Had that visit patient hadlabs drawn which showed potassium of 2.7 and was instructed to come to the ER. Patient denies any nausea vomiting diarrhea. Takes hydrochlorothiazide for hypertension.  ED Course:In the ER repeat labs show potassium of 2.4.. And was given 60 p.o. potassium chloride and 20 of IV potassium run following which potassium only increased to 2.6. EKG shows normal sinus rhythm with some U waves and given the changes patient has been admitted for further observation and correction of potassium."  Hospital Course:  Summary of her active problems in the hospital is as following. 1. Severe hypokalemia Presents with fatigue. Found to have hypokalemia with replacement patient is still hypokalemic and with EKG changes patient was admitted for further observation.  Potassium was aggressively replaced  with IV as well as oral replacement. On the day of the discharge potassium is 3.4. Magnesium stable. Suspect this is secondary to hydrochlorothiazide. Currently holding that medicine on discharge. Continuing 20 mg of potassium for 7 more days. Recommend patient to follow-up with PCP with repeat BMP.  2. Acute bronchitis on Omnicef.   Complete the course. No acute abnormality. Breathing okay.  3. Chronic wound of the left lower extremity from pyoderma gangrenosum per primary care physician.  Patient feels her wound is improved after her recent antibiotics and prednisone.  Wound team consult requested.  4. Hypertension Blood pressure soft, orthostatics negative. Patient blood pressure medications were on hold on the hospital. We will continue to hold them at home. Recommend follow-up with PCP regarding initiation of the blood pressure medications. With hypokalemia as well as hypertension patient may have primary hyperaldosteronism and would benefit from further work-up outpatient.  All other chronic medical condition were stable during the hospitalization.  Patient was ambulatory without any assistance. On the day of the discharge the patient's vitals were stable , and no other acute medical condition were reported by patient. the patient was felt safe to be discharge at home with family.  ED/Hospital notes reviewed.   Social:  [place] [employer]  ROS:   Constitutional:  No f/c, No night sweats, No unexplained weight loss. EENT:  No vision changes, No blurry vision, No hearing changes. No mouth, throat, or ear problems.  Respiratory: No cough, No SOB Cardiac: No CP, no palpitations GI:  No abd pain, No N/V/D. GU: No Urinary s/sx Musculoskeletal: No joint  pain Neuro: No headache, no dizziness, no motor weakness.  Skin: No rash Endocrine:  No polydipsia. No polyuria.  Psych: Denies SI/HI  No problems updated.  ALLERGIES: No Known Allergies  PAST MEDICAL  HISTORY: Past Medical History:  Diagnosis Date  . DVT (deep venous thrombosis) (HCC) 2016  . Hypertension     MEDICATIONS AT HOME: Prior to Admission medications   Medication Sig Start Date End Date Taking? Authorizing Provider  potassium chloride SA (K-DUR,KLOR-CON) 20 MEQ tablet Take 1 tablet (20 mEq total) by mouth daily. 10/10/18  Yes Rolly Salter, MD  Zinc Oxide (TRIPLE PASTE) 12.8 % ointment Apply 1 application topically as needed for irritation. 07/17/18  Yes Claiborne Rigg, NP  amLODipine (NORVASC) 10 MG tablet Take 1 tablet (10 mg total) by mouth daily. 10/29/18   Anders Simmonds, PA-C  cephALEXin (KEFLEX) 500 MG capsule Take 1 capsule (500 mg total) by mouth 4 (four) times daily. 10/29/18   Anders Simmonds, PA-C     Objective:  EXAM:   Vitals:   10/29/18 1513  BP: (!) 169/125  Pulse: 89  Resp: 16  Temp: 99.2 F (37.3 C)  TempSrc: Oral  SpO2: 99%  Weight: 224 lb (101.6 kg)    General appearance : A&OX3. NAD. Non-toxic-appearing HEENT: Atraumatic and Normocephalic.  PERRLA. EOM intact.  Neck: supple, no JVD. No cervical lymphadenopathy. No thyromegaly Chest/Lungs:  Breathing-non-labored, Good air entry bilaterally, breath sounds normal without rales, rhonchi, or wheezing  CVS: S1 S2 regular, no murmurs, gallops, rubs . Extremities: Bilateral Lower Ext shows no edema, both legs are warm to touch with = pulse throughout Neurology:  CN II-XII grossly intact, Non focal.   Psych:  TP linear. J/I WNL. Normal speech. Appropriate eye contact and affect.  Skin:  4X5cm ulceration L lateral lower leg.  Xeroform applied and non-stick bandage then ace wraps applied.  Elevate daily.    Data Review No results found for: HGBA1C   Assessment & Plan   1. Hypokalemia - Basic metabolic panel  2. Essential hypertension Uncontrolled-resume amlodipine.  Check BP daily and record and bring to next visit.   - cloNIDine (CATAPRES) tablet 0.2 mg - Basic metabolic panel -  amLODipine (NORVASC) 10 MG tablet; Take 1 tablet (10 mg total) by mouth daily.  Dispense: 90 tablet; Refill: 3  3. Pyoderma gangrenosum New dressing with petroleum gauze, non-stick bandage and ACE wrap X 2 done by me.   - cephALEXin (KEFLEX) 500 MG capsule; Take 1 capsule (500 mg total) by mouth 4 (four) times daily.  Dispense: 30 capsule; Refill: 0 Unwilling to go to wound care due to cost.  I have discussed R/B.  Elevate daily.    4. Hospital discharge follow-up recheck labs today   Patient have been counseled extensively about nutrition and exercise  Return in about 2 weeks (around 11/12/2018) for Dr Fulp-BP and leg wound.  The patient was given clear instructions to go to ER or return to medical center if symptoms don't improve, worsen or new problems develop. The patient verbalized understanding. The patient was told to call to get lab results if they haven't heard anything in the next week.    Georgian Co, PA-C [facility] [place], [place] [phone]   10/29/2018, 3:48 PM

## 2018-10-29 ENCOUNTER — Other Ambulatory Visit: Payer: Self-pay

## 2018-10-29 ENCOUNTER — Ambulatory Visit: Payer: Self-pay | Attending: Family Medicine | Admitting: Physician Assistant

## 2018-10-29 VITALS — BP 152/88 | HR 73 | Temp 99.2°F | Resp 16 | Wt 224.0 lb

## 2018-10-29 DIAGNOSIS — M79606 Pain in leg, unspecified: Secondary | ICD-10-CM | POA: Insufficient documentation

## 2018-10-29 DIAGNOSIS — Z79899 Other long term (current) drug therapy: Secondary | ICD-10-CM | POA: Insufficient documentation

## 2018-10-29 DIAGNOSIS — I10 Essential (primary) hypertension: Secondary | ICD-10-CM | POA: Insufficient documentation

## 2018-10-29 DIAGNOSIS — L88 Pyoderma gangrenosum: Secondary | ICD-10-CM | POA: Insufficient documentation

## 2018-10-29 DIAGNOSIS — J209 Acute bronchitis, unspecified: Secondary | ICD-10-CM | POA: Insufficient documentation

## 2018-10-29 DIAGNOSIS — Z09 Encounter for follow-up examination after completed treatment for conditions other than malignant neoplasm: Secondary | ICD-10-CM | POA: Insufficient documentation

## 2018-10-29 DIAGNOSIS — E876 Hypokalemia: Secondary | ICD-10-CM | POA: Insufficient documentation

## 2018-10-29 DIAGNOSIS — R9431 Abnormal electrocardiogram [ECG] [EKG]: Secondary | ICD-10-CM | POA: Insufficient documentation

## 2018-10-29 DIAGNOSIS — Z86718 Personal history of other venous thrombosis and embolism: Secondary | ICD-10-CM | POA: Insufficient documentation

## 2018-10-29 MED ORDER — CLONIDINE HCL 0.2 MG PO TABS
0.2000 mg | ORAL_TABLET | Freq: Once | ORAL | Status: AC
  Administered 2018-10-29: 0.2 mg via ORAL

## 2018-10-29 MED ORDER — CEPHALEXIN 500 MG PO CAPS: 500.0000 mg | ORAL_CAPSULE | Freq: Four times a day (QID) | ORAL | 0 refills | Status: DC

## 2018-10-29 MED ORDER — AMLODIPINE BESYLATE 10 MG PO TABS: 10.0000 mg | ORAL_TABLET | Freq: Every day | ORAL | 3 refills | Status: DC

## 2018-10-29 NOTE — Patient Instructions (Signed)
Check blood pressure daily and record and bring to next visit 

## 2018-10-29 NOTE — Progress Notes (Signed)
HFU- hypokalemia 178/109

## 2018-10-30 ENCOUNTER — Telehealth (INDEPENDENT_AMBULATORY_CARE_PROVIDER_SITE_OTHER): Payer: Self-pay

## 2018-10-30 LAB — BASIC METABOLIC PANEL
BUN/Creatinine Ratio: 14 (ref 9–23)
BUN: 12 mg/dL (ref 6–24)
CO2: 24 mmol/L (ref 20–29)
CREATININE: 0.88 mg/dL (ref 0.57–1.00)
Calcium: 8.9 mg/dL (ref 8.7–10.2)
Chloride: 107 mmol/L — ABNORMAL HIGH (ref 96–106)
GFR calc non Af Amer: 77 mL/min/{1.73_m2} (ref >59)
GFR, EST AFRICAN AMERICAN: 89 mL/min/{1.73_m2} (ref >59)
Glucose: 93 mg/dL (ref 65–99)
Potassium: 3.4 mmol/L — ABNORMAL LOW (ref 3.5–5.2)
SODIUM: 145 mmol/L — AB (ref 134–144)

## 2018-10-30 NOTE — Telephone Encounter (Signed)
-----   Message from Anders SimmondsAngela M McClung, [place] sent at 10/30/2018  7:52 AM EST ----- Please call patient.  Tell her to drink adequate water as we discussed.  Tell her to eat potassium rich foods and follow up as planned.  Her potassium is only slightly low now.  It is fine for her to take amlodipine.  Check blood pressures and follow-up as planned.  Thanks, Georgian CoAngela McClung, PA-C

## 2018-10-30 NOTE — Telephone Encounter (Signed)
Left voicemail advising patient to drink adequate water as we discussed. Tell her to eat potassium rich foods and follow up as planned. Her potassium is only slightly low now. It is fine for her to take amlodipine. Check blood pressures and follow-up as planned. call clinic with any questions. Maryjean Mornempestt S Roberts, CMA

## 2018-11-13 ENCOUNTER — Ambulatory Visit: Payer: Medicaid Other | Admitting: Family Medicine

## 2018-12-03 ENCOUNTER — Emergency Department (HOSPITAL_COMMUNITY)
Admission: EM | Admit: 2018-12-03 | Discharge: 2018-12-04 | Disposition: A | Discharge status: Home / Self Care | Payer: Medicaid Other | Attending: Emergency Medicine | Admitting: Emergency Medicine

## 2018-12-03 ENCOUNTER — Encounter (HOSPITAL_COMMUNITY): Payer: Self-pay

## 2018-12-03 ENCOUNTER — Other Ambulatory Visit: Payer: Self-pay

## 2018-12-03 DIAGNOSIS — Z79899 Other long term (current) drug therapy: Secondary | ICD-10-CM | POA: Insufficient documentation

## 2018-12-03 DIAGNOSIS — E876 Hypokalemia: Secondary | ICD-10-CM | POA: Insufficient documentation

## 2018-12-03 DIAGNOSIS — I10 Essential (primary) hypertension: Secondary | ICD-10-CM | POA: Insufficient documentation

## 2018-12-03 DIAGNOSIS — I471 Supraventricular tachycardia: Secondary | ICD-10-CM | POA: Insufficient documentation

## 2018-12-03 DIAGNOSIS — R42 Dizziness and giddiness: Secondary | ICD-10-CM | POA: Insufficient documentation

## 2018-12-03 DIAGNOSIS — F17210 Nicotine dependence, cigarettes, uncomplicated: Secondary | ICD-10-CM | POA: Insufficient documentation

## 2018-12-03 LAB — COMPREHENSIVE METABOLIC PANEL
ALBUMIN: 3.4 g/dL — AB (ref 3.5–5.0)
ALK PHOS: 59 U/L (ref 38–126)
ALT: 16 U/L (ref 0–44)
AST: 20 U/L (ref 15–41)
Anion gap: 13 (ref 5–15)
BUN: 12 mg/dL (ref 6–20)
CALCIUM: 8.8 mg/dL — AB (ref 8.9–10.3)
CO2: 24 mmol/L (ref 22–32)
Chloride: 99 mmol/L (ref 98–111)
Creatinine, Ser: 0.98 mg/dL (ref 0.44–1.00)
GFR calc non Af Amer: >60 mL/min (ref >60)
GLUCOSE: 121 mg/dL — AB (ref 70–99)
POTASSIUM: 2.8 mmol/L — AB (ref 3.5–5.1)
SODIUM: 136 mmol/L (ref 135–145)
TOTAL PROTEIN: 8.4 g/dL — AB (ref 6.5–8.1)
Total Bilirubin: 0.7 mg/dL (ref 0.3–1.2)

## 2018-12-03 LAB — CBC
HCT: 42.9 % (ref 36.0–46.0)
HEMOGLOBIN: 13.4 g/dL (ref 12.0–15.0)
MCH: 27.5 pg (ref 26.0–34.0)
MCHC: 31.2 g/dL (ref 30.0–36.0)
MCV: 88.1 fL (ref 80.0–100.0)
Platelets: 165 10*3/uL (ref 150–400)
RBC: 4.87 MIL/uL (ref 3.87–5.11)
RDW: 14 % (ref 11.5–15.5)
WBC: 7.8 10*3/uL (ref 4.0–10.5)
nRBC: 0 % (ref 0.0–0.2)

## 2018-12-03 LAB — MAGNESIUM: MAGNESIUM: 2 mg/dL (ref 1.7–2.4)

## 2018-12-03 LAB — TSH: TSH: 2.885 u[IU]/mL (ref 0.350–4.500)

## 2018-12-03 LAB — PHOSPHORUS: PHOSPHORUS: 2.9 mg/dL (ref 2.5–4.6)

## 2018-12-03 MED ORDER — DILTIAZEM HCL 25 MG/5ML IV SOLN
20.0000 mg | Freq: Once | INTRAVENOUS | Status: AC
  Administered 2018-12-03: 20 mg via INTRAVENOUS

## 2018-12-03 MED ORDER — POTASSIUM CHLORIDE CRYS ER 20 MEQ PO TBCR
60.0000 meq | EXTENDED_RELEASE_TABLET | Freq: Once | ORAL | Status: AC
  Administered 2018-12-03: 60 meq via ORAL
  Filled 2018-12-03: qty 3

## 2018-12-03 MED ORDER — POTASSIUM CHLORIDE 10 MEQ/100ML IV SOLN
10.0000 meq | INTRAVENOUS | Status: AC
  Administered 2018-12-03 (×3): 10 meq via INTRAVENOUS
  Filled 2018-12-03 (×2): qty 100

## 2018-12-03 MED ORDER — DILTIAZEM HCL 25 MG/5ML IV SOLN
15.0000 mg | Freq: Once | INTRAVENOUS | Status: AC
  Administered 2018-12-03: 15 mg via INTRAVENOUS
  Filled 2018-12-03: qty 5

## 2018-12-03 MED ORDER — DILTIAZEM HCL 30 MG PO TABS: 60.0000 mg | ORAL_TABLET | Freq: Three times a day (TID) | ORAL | 0 refills | Status: DC

## 2018-12-03 MED ORDER — DILTIAZEM HCL 60 MG PO TABS
60.0000 mg | ORAL_TABLET | Freq: Once | ORAL | Status: AC
  Administered 2018-12-03: 60 mg via ORAL
  Filled 2018-12-03: qty 1

## 2018-12-03 MED ORDER — SODIUM CHLORIDE 0.9 % IV BOLUS
500.0000 mL | Freq: Once | INTRAVENOUS | Status: AC
  Administered 2018-12-03: 500 mL via INTRAVENOUS

## 2018-12-03 MED ORDER — POTASSIUM CHLORIDE CRYS ER 20 MEQ PO TBCR: 20.0000 meq | EXTENDED_RELEASE_TABLET | Freq: Every day | ORAL | 0 refills | Status: DC

## 2018-12-03 NOTE — ED Provider Notes (Signed)
[facility] EMERGENCY DEPARTMENT Provider Note   CSN: [id number] Arrival date & time: 12/03/18  1820     History   Chief Complaint Chief Complaint  Patient presents with  . Weakness  . Dizziness    HPI Cheryl Mckinney is a 53 y.o. female.  HPI Patient is a 53 year old female presents the emergency department with acute onset palpitations today with associated generalized weakness and dizziness without syncope.  She states that she has had palpitations before in the past but they have never lasted this long.  No fevers or chills.  She was told that she was hypokalemic back in November and took potassium supplementation for a short period time but has not had her potassium rechecked and is not on any potassium supplementation at this time.  No chest pain.  Some mild associated shortness of breath.  Found to be in SVT on arrival with heart rates into the 190s.   Past Medical History:  Diagnosis Date  . DVT (deep venous thrombosis) (HCC) 2016  . Hypertension     Patient Active Problem List   Diagnosis Date Noted  . Essential hypertension 10/09/2018  . Leg wound, left 10/09/2018  . Acute bronchitis   . Hypokalemia 10/08/2018    Past Surgical History:  Procedure Laterality Date  . CESAREAN SECTION     x5  . TUBAL LIGATION       OB History   No obstetric history on file.      Home Medications    Prior to Admission medications   Medication Sig Start Date End Date Taking? Authorizing Provider  amLODipine (NORVASC) 10 MG tablet Take 1 tablet (10 mg total) by mouth daily. Patient not taking: Reported on 12/03/2018 10/29/18   Anders Simmonds, PA-C  cephALEXin (KEFLEX) 500 MG capsule Take 1 capsule (500 mg total) by mouth 4 (four) times daily. Patient not taking: Reported on 12/03/2018 10/29/18   Anders Simmonds, PA-C  diltiazem (CARDIZEM) 30 MG tablet Take 2 tablets (60 mg total) by mouth 3 (three) times daily. 12/03/18   Azalia Bilis, MD  potassium  chloride SA (K-DUR,KLOR-CON) 20 MEQ tablet Take 1 tablet (20 mEq total) by mouth daily. 12/03/18   Azalia Bilis, MD  Zinc Oxide (TRIPLE PASTE) 12.8 % ointment Apply 1 application topically as needed for irritation. Patient not taking: Reported on 12/03/2018 07/17/18   Claiborne Rigg, NP    Family History Family History  Problem Relation Age of Onset  . Hypertension Mother   . Diabetes Mother     Social History Social History   Tobacco Use  . Smoking status: Current Every Day Smoker    Packs/day: 0.50    Types: Cigarettes  . Smokeless tobacco: Never Used  Substance Use Topics  . Alcohol use: Not Currently    Comment: once-twice weeks.   . Drug use: Yes    Types: Marijuana     Allergies   Patient has no known allergies.   Review of Systems Review of Systems  All other systems reviewed and are negative.    Physical Exam Updated Vital Signs BP 139/83   Pulse 81   Temp 100.3 F (37.9 C) (Oral)   Resp (!) 23   SpO2 96%   Physical Exam Vitals signs and nursing note reviewed.  Constitutional:      General: She is not in acute distress.    Appearance: She is well-developed.  HENT:     Head: Normocephalic and atraumatic.  Neck:  Musculoskeletal: Normal range of motion.  Cardiovascular:     Rate and Rhythm: Regular rhythm. Tachycardia present.     Heart sounds: Normal heart sounds.  Pulmonary:     Effort: Pulmonary effort is normal.     Breath sounds: Normal breath sounds.  Abdominal:     General: There is no distension.     Palpations: Abdomen is soft.     Tenderness: There is no abdominal tenderness.  Musculoskeletal: Normal range of motion.  Skin:    General: Skin is warm and dry.  Neurological:     Mental Status: She is alert and oriented to person, place, and time.  Psychiatric:        Judgment: Judgment normal.      ED Treatments / Results  Labs (all labs ordered are listed, but only abnormal results are displayed) Labs Reviewed    COMPREHENSIVE METABOLIC PANEL - Abnormal; Notable for the following components:      Result Value   Potassium 2.8 (*)    Glucose, Bld 121 (*)    Calcium 8.8 (*)    Total Protein 8.4 (*)    Albumin 3.4 (*)    All other components within normal limits  CBC  TSH  MAGNESIUM  PHOSPHORUS    EKG None  Radiology No results found.  Procedures Procedures (including critical care time)  Medications Ordered in ED Medications  diltiazem (CARDIZEM) injection 20 mg (20 mg Intravenous Given 12/03/18 1839)  sodium chloride 0.9 % bolus 500 mL (0 mLs Intravenous Stopped 12/03/18 2106)  diltiazem (CARDIZEM) tablet 60 mg (60 mg Oral Given 12/03/18 2105)  diltiazem (CARDIZEM) injection 15 mg (15 mg Intravenous Given 12/03/18 2019)  potassium chloride SA (K-DUR,KLOR-CON) CR tablet 60 mEq (60 mEq Oral Given 12/03/18 2026)  potassium chloride 10 mEq in 100 mL IVPB (0 mEq Intravenous Stopped 12/03/18 2233)     Initial Impression / Assessment and Plan / ED Course  I have reviewed the triage vital signs and the nursing notes.  Pertinent labs & imaging results that were available during my care of the patient were reviewed by me and considered in my medical decision making (see chart for details).     Patient was able to be converted with carotid massage.  She was taught vagal maneuvers and this was utilized several times while in the emergency department by the patient to break her self back out of SVT.  She was given IV Cardizem.  Her potassium is being replaced both orally and IV at this time and we will continue to observe the patient.  11:32 PM Patient feels much better at this time.  No SVT over the past 2 hours.  Home with potassium supplementation.  Home on low-dose calcium channel blockade.  Outpatient primary care and cardiology follow-up.  Patient feels much better.  Stable for discharge.  Ongoing work-up in regards to her hypokalemia will need to occur as an outpatient.  Final Clinical  Impressions(s) / ED Diagnoses   Final diagnoses:  SVT (supraventricular tachycardia) (HCC)  Hypokalemia    ED Discharge Orders         Ordered    potassium chloride SA (K-DUR,KLOR-CON) 20 MEQ tablet  Daily     12/03/18 2323    diltiazem (CARDIZEM) 30 MG tablet  3 times daily     12/03/18 2330           Azalia Bilis, MD 12/03/18 2333

## 2018-12-03 NOTE — ED Notes (Signed)
Reviewed d/c instructions with pt, who verbalized understanding and had no outstanding questions. Pt departed in NAD.   

## 2018-12-03 NOTE — ED Notes (Signed)
ED Provider at bedside. 

## 2018-12-03 NOTE — ED Triage Notes (Signed)
Pt from home with ems for generalized weakness and dizziness for 3 days. Pt also reports flu like symptoms, took alka seltzer yesterday and feels a little better but is still having some dizziness. EMS reports pt hyperventilating on their arrival, pt alert, oriented and calm upon arrival to ED, nad noted. VSS

## 2018-12-04 MED FILL — POTASSIUM CL ER 20 MEQ TAB: 20 | 10 days supply | Qty: 10 | Fill #0

## 2018-12-04 MED FILL — dilTIAZem HCL 30 MG TABS: 30 | 10 days supply | Qty: 60 | Fill #0

## 2018-12-07 ENCOUNTER — Ambulatory Visit: Payer: Self-pay | Attending: Family Medicine | Admitting: Family Medicine

## 2018-12-07 ENCOUNTER — Encounter: Payer: Self-pay | Admitting: Family Medicine

## 2018-12-07 VITALS — BP 144/83 | HR 76 | Temp 98.7°F | Resp 18 | Ht 71.0 in

## 2018-12-07 DIAGNOSIS — Z09 Encounter for follow-up examination after completed treatment for conditions other than malignant neoplasm: Secondary | ICD-10-CM

## 2018-12-07 DIAGNOSIS — L089 Local infection of the skin and subcutaneous tissue, unspecified: Secondary | ICD-10-CM

## 2018-12-07 DIAGNOSIS — S81802A Unspecified open wound, left lower leg, initial encounter: Secondary | ICD-10-CM

## 2018-12-07 DIAGNOSIS — I471 Supraventricular tachycardia, unspecified: Secondary | ICD-10-CM

## 2018-12-07 DIAGNOSIS — E876 Hypokalemia: Secondary | ICD-10-CM

## 2018-12-07 DIAGNOSIS — T148XXA Other injury of unspecified body region, initial encounter: Secondary | ICD-10-CM

## 2018-12-07 MED ORDER — AMOXICILLIN-POT CLAVULANATE 500-125 MG PO TABS: 1.0000 | ORAL_TABLET | Freq: Two times a day (BID) | ORAL | 0 refills | Status: AC

## 2018-12-07 NOTE — Progress Notes (Signed)
Established Patient Office Visit  Subjective:  Patient ID: Cheryl Mckinney, female    DOB: 07/15/1966  Age: 53 y.o. MRN: [id number]  CC: hospital follow-up  HPI Cheryl Mckinney presents for follow-up of recent overnight emergency department observation for supraventricular tachycardia and patient with history of hypokalemia.  Patient also has history of a chronic wound to the left lower extremity.  Patient had potassium level of 2.8 in the emergency department.  Patient was given IV Cardizem and taught how to do carotid massage which helped to stop the SVT.  Patient was given oral and IV potassium replacement.  Patient will need outpatient cardiology referral.      At today's visit, patient states that she feels much better than when she initially presented to the emergency department on 12/03/2018.  Patient reports that on the day of her emergency department visit she had onset of sensation of her heart racing as well as dizziness, weakness and some shortness of breath.  Patient denies any current symptoms of dizziness, weakness, shortness of breath, muscle cramping, chest pain or palpitations/sensation of increased heart rate.  Patient states that she was given medication by IV as well as being taught to massage her neck in order to decrease her heart rate.  Patient reports no episodes of increased heart rate since her emergency department visit.  Patient is taking the medication that was prescribed, diltiazem, at time of her hospital discharge.  Patient also reports that she was given bad tasting potassium replacement. She now feels much better than at her last visit.       She also has had some recent increase in drainage and discomfort in the chronic wound on her left lower leg. She is trying to place vaseline on the area and to keep the area clean. Drainage has been yellow-brown and smells bad, No fever or chills.   Past Medical History:  Diagnosis Date  . DVT (deep venous thrombosis) (HCC) 2016  .  Hypertension     Past Surgical History:  Procedure Laterality Date  . CESAREAN SECTION     x5  . TUBAL LIGATION      Family History  Problem Relation Age of Onset  . Hypertension Mother   . Diabetes Mother     Social History   Socioeconomic History  . Marital status: Single    Spouse name: Not on file  . Number of children: Not on file  . Years of education: Not on file  . Highest education level: Not on file  Occupational History  . Not on file  Social Needs  . Financial resource strain: Not on file  . Food insecurity:    Worry: Not on file    Inability: Not on file  . Transportation needs:    Medical: Not on file    Non-medical: Not on file  Tobacco Use  . Smoking status: Current Every Day Smoker    Packs/day: 0.50    Types: Cigarettes  . Smokeless tobacco: Never Used  Substance and Sexual Activity  . Alcohol use: Not Currently    Comment: once-twice weeks.   . Drug use: Yes    Types: Marijuana  . Sexual activity: Not Currently  Lifestyle  . Physical activity:    Days per week: Not on file    Minutes per session: Not on file  . Stress: Not on file  Relationships  . Social connections:    Talks on phone: Not on file    Gets together: Not on  file    Attends religious service: Not on file    Active member of club or organization: Not on file    Attends meetings of clubs or organizations: Not on file    Relationship status: Not on file  . Intimate partner violence:    Fear of current or ex partner: Not on file    Emotionally abused: Not on file    Physically abused: Not on file    Forced sexual activity: Not on file  Other Topics Concern  . Not on file  Social History Narrative  . Not on file    Outpatient Medications Prior to Visit  Medication Sig Dispense Refill  . amLODipine (NORVASC) 10 MG tablet Take 1 tablet (10 mg total) by mouth daily. (Patient not taking: Reported on 12/03/2018) 90 tablet 3  . cephALEXin (KEFLEX) 500 MG capsule Take 1  capsule (500 mg total) by mouth 4 (four) times daily. (Patient not taking: Reported on 12/03/2018) 30 capsule 0  . diltiazem (CARDIZEM) 30 MG tablet Take 2 tablets (60 mg total) by mouth 3 (three) times daily. 60 tablet 0  . potassium chloride SA (K-DUR,KLOR-CON) 20 MEQ tablet Take 1 tablet (20 mEq total) by mouth daily. 10 tablet 0  . Zinc Oxide (TRIPLE PASTE) 12.8 % ointment Apply 1 application topically as needed for irritation. (Patient not taking: Reported on 12/03/2018) 56.7 g 1   Facility-Administered Medications Prior to Visit  Medication Dose Route Frequency Provider Last Rate Last Dose  . ipratropium-albuterol (DUONEB) 0.5-2.5 (3) MG/3ML nebulizer solution 3 mL  3 mL Nebulization Once Laynee Lockamy, MD        No Known Allergies  ROS Review of Systems  Constitutional: Positive for fatigue. Negative for chills and fever.  HENT: Negative for sore throat and trouble swallowing.   Respiratory: Negative for cough and shortness of breath.   Cardiovascular: Negative for chest pain, palpitations and leg swelling.  Endocrine: Negative for polydipsia, polyphagia and polyuria.  Genitourinary: Negative for dysuria and frequency.  Musculoskeletal: Positive for arthralgias and myalgias.  Skin: Positive for wound. Negative for rash.  Neurological: Negative for dizziness and headaches.  Hematological: Negative for adenopathy. Does not bruise/bleed easily.      Objective:    Physical Exam  BP (!) 144/83 (BP Location: Left Arm, Patient Position: Sitting, Cuff Size: Normal)   Pulse 76   Temp 98.7 F (37.1 C) (Oral)   Resp 18   Ht 5\' 11"  (1.803 m)   SpO2 96%   BMI 31.24 kg/m  Wt Readings from Last 3 Encounters:  10/29/18 224 lb (101.6 kg)  10/09/18 212 lb 11.9 oz (96.5 kg)  10/06/18 203 lb (92.1 kg)   General-well-nourished, well-developed obese female in no acute distress Neck-supple, no lymphadenopathy, no thyromegaly Lungs-clear to auscultation bilaterally Cardiovascular-regular  rate and regular rhythm Abdomen- truncal obesity, soft and nontender Back-no CVA tenderness Extremities- patient with some mild edema of the left lower extremity at the site of the chronic lateral wound and area below Skin- patient with open wound on the left lateral shin/lateral leg.  Area with mild erythema of the wound bed and patient with yellowish-brown drainage on bandage which was removed from the wound site  Health Maintenance Due  Topic Date Due  . PAP SMEAR-Modifier  03/17/1989  . MAMMOGRAM  03/17/2018  . COLONOSCOPY  03/17/2018  . INFLUENZA VACCINE  06/18/2018      Lab Results  Component Value Date   TSH 2.885 12/03/2018   Lab Results  Component Value Date   WBC 7.8 12/03/2018   HGB 13.4 12/03/2018   HCT 42.9 12/03/2018   MCV 88.1 12/03/2018   PLT 165 12/03/2018   Lab Results  Component Value Date   NA 136 12/03/2018   K 2.8 (L) 12/03/2018   CO2 24 12/03/2018   GLUCOSE 121 (H) 12/03/2018   BUN 12 12/03/2018   CREATININE 0.98 12/03/2018   BILITOT 0.7 12/03/2018   ALKPHOS 59 12/03/2018   AST 20 12/03/2018   ALT 16 12/03/2018   PROT 8.4 (H) 12/03/2018   ALBUMIN 3.4 (L) 12/03/2018   CALCIUM 8.8 (L) 12/03/2018   ANIONGAP 13 12/03/2018   No results found for: CHOL No results found for: HDL No results found for: LDLCALC No results found for: TRIG No results found for: CHOLHDL No results found for: RRNH6F    Assessment & Plan:   1. Paroxysmal SVT (supraventricular tachycardia) (HCC) Patient status post ED visit for paroxysmal SVT.  Referral placed for cardiology however patient states that she is already been contacted regarding a follow-up appointment.  Patient's emergency department notes reviewed from her 12/03/2018 visit at which time she was diagnosed with SVT after having heart rate elevated in the 190s upon her arrival to the emergency department.  Patient also had potassium level of 2.8 and glucose of 121 on labs done in the emergency department on  12/03/2018.  She was given oral and IV potassium replacement.  Patient was also taught maneuvers including carotid massage and vagal maneuvers that helped to bring her back into a normal heart rate and patient additionally was given IV diltiazem in the emergency department and discharged on low-dose diltiazem.  Patient will have BMP at today's visit in follow-up of low potassium and elevated blood sugar and patient is encouraged to keep her follow up appointment with cardiology.  Patient is also asked to avoid stimulant supplements and caffeine. - Basic Metabolic Panel  2. Hypokalemia Patient had low potassium of 2.8 during her emergency department visit and patient was given oral and IV potassium replacement.  Patient will have BMP in follow-up of hypokalemia - Basic Metabolic Panel  3. Infected wound Patient with a chronic wound on the left lower leg which appears to be infected.  Patient is being placed on Augmentin.  Patient is asked to return to clinic in approximately 1 week for follow-up.  Patient should return sooner if she has worsening of her infection or any concerns. - amoxicillin-clavulanate (AUGMENTIN) 500-125 MG tablet; Take 1 tablet (500 mg total) by mouth 2 (two) times daily for 10 days. Take after eating  Dispense: 20 tablet; Refill: 0  4. Hospital discharge follow-up Patient was seen at today's visit in follow-up of emergency department visit on 12/03/2018 secondary to elevated heart rate for which patient was found to be in SVT and patient with hypokalemia.  ED records were reviewed and patient's conditions and follow-up were discussed with the patient at today's visit    Follow-up: Return in about 1 week (around 12/14/2018) for f/u potassium and wound.    Cain Saupe, MD

## 2018-12-08 ENCOUNTER — Telehealth: Payer: Self-pay | Admitting: *Deleted

## 2018-12-08 LAB — BASIC METABOLIC PANEL WITH GFR
BUN/Creatinine Ratio: 15 (ref 9–23)
BUN: 13 mg/dL (ref 6–24)
CO2: 24 mmol/L (ref 20–29)
Calcium: 9.4 mg/dL (ref 8.7–10.2)
Chloride: 103 mmol/L (ref 96–106)
Creatinine, Ser: 0.87 mg/dL (ref 0.57–1.00)
GFR calc Af Amer: 90 mL/min/1.73
GFR calc non Af Amer: 78 mL/min/1.73
Glucose: 84 mg/dL (ref 65–99)
Potassium: 4.3 mmol/L (ref 3.5–5.2)
Sodium: 143 mmol/L (ref 134–144)

## 2018-12-08 MED FILL — AMOX-CLAV 500-125 MG TABLET: 500-125 | 10 days supply | Qty: 20 | Fill #0

## 2018-12-08 NOTE — Telephone Encounter (Signed)
Medical Assistant left message on patient's home and cell voicemail. Voicemail states to give a call back to [place] with [facility] at [phone]. Patient is aware of potassium being normal and to keep the 1 week recheck.

## 2018-12-08 NOTE — Telephone Encounter (Signed)
-----   Message from Cain Saupe, MD sent at 12/08/2018  1:42 PM EST ----- Please notify patient that her BMP is normal including potassium but she should still return in 1 week for recheck of potassium

## 2018-12-13 NOTE — Progress Notes (Deleted)
Cardiology Office Note   Date:  12/13/2018   ID:  Cheryl Mckinney, DOB 08/29/1966, MRN [id number]  PCP:  Cain Saupe, MD  Cardiologist:   Reneta Niehaus [place], MD   No chief complaint on file.     History of Present Illness: Cheryl Mckinney is a 53 y.o. female who is seen at the request of Azalia Bilis MD for evaluation of SVT. She has a history of HTN and remote DVT in 2016. She presented to the ED on 12/03/18 with sustained tachycardia and was found to be in SVT at rate 190. Converted with carotid sinus massage. DC on po Cardizem. She has a history of severe hypokalemia on HCTZ.     Past Medical History:  Diagnosis Date  . DVT (deep venous thrombosis) (HCC) 2016  . Hypertension     Past Surgical History:  Procedure Laterality Date  . CESAREAN SECTION     x5  . TUBAL LIGATION       Current Outpatient Medications  Medication Sig Dispense Refill  . amLODipine (NORVASC) 10 MG tablet Take 1 tablet (10 mg total) by mouth daily. 90 tablet 3  . amoxicillin-clavulanate (AUGMENTIN) 500-125 MG tablet Take 1 tablet (500 mg total) by mouth 2 (two) times daily for 10 days. Take after eating 20 tablet 0  . diltiazem (CARDIZEM) 30 MG tablet Take 2 tablets (60 mg total) by mouth 3 (three) times daily. 60 tablet 0  . potassium chloride SA (K-DUR,KLOR-CON) 20 MEQ tablet Take 1 tablet (20 mEq total) by mouth daily. 10 tablet 0  . Zinc Oxide (TRIPLE PASTE) 12.8 % ointment Apply 1 application topically as needed for irritation. (Patient not taking: Reported on 12/03/2018) 56.7 g 1   Current Facility-Administered Medications  Medication Dose Route Frequency Provider Last Rate Last Dose  . ipratropium-albuterol (DUONEB) 0.5-2.5 (3) MG/3ML nebulizer solution 3 mL  3 mL Nebulization Once Fulp, Cammie, MD        Allergies:   Patient has no known allergies.    Social History:  The patient  reports that she has been smoking cigarettes. She has been smoking about 0.50 packs per day. She has never used  smokeless tobacco. She reports previous alcohol use. She reports current drug use. Drug: Marijuana.   Family History:  The patient's ***family history includes Diabetes in her mother; Hypertension in her mother.    ROS:  Please see the history of present illness.   Otherwise, review of systems are positive for {NONE DEFAULTED:18576::"none"}.   All other systems are reviewed and negative.    PHYSICAL EXAM: VS:  There were no vitals taken for this visit. , BMI There is no height or weight on file to calculate BMI. GEN: Well nourished, well developed, in no acute distress  HEENT: normal  Neck: no JVD, carotid bruits, or masses Cardiac: ***RRR; no murmurs, rubs, or gallops,no edema  Respiratory:  clear to auscultation bilaterally, normal work of breathing GI: soft, nontender, nondistended, + BS MS: no deformity or atrophy  Skin: warm and dry, no rash Neuro:  Strength and sensation are intact Psych: euthymic mood, full affect   EKG:  EKG {ACTION; IS/IS FYB:[id number]} ordered today. The ekg ordered today demonstrates ***   Recent Labs: 12/03/2018: ALT 16; Hemoglobin 13.4; Magnesium 2.0; Platelets 165; TSH 2.885 12/07/2018: BUN 13; Creatinine, Ser 0.87; Potassium 4.3; Sodium 143    Lipid Panel No results found for: CHOL, TRIG, HDL, CHOLHDL, VLDL, LDLCALC, LDLDIRECT    Wt Readings from Last 3 Encounters:  10/29/18 224 lb (101.6 kg)  10/09/18 212 lb 11.9 oz (96.5 kg)  10/06/18 203 lb (92.1 kg)      Other studies Reviewed: Additional studies/ records that were reviewed today include: ***. Review of the above records demonstrates: ***   ASSESSMENT AND PLAN:  1.  ***   Current medicines are reviewed at length with the patient today.  The patient {ACTIONS; HAS/DOES NOT HAVE:19233} concerns regarding medicines.  The following changes have been made:  {PLAN; NO CHANGE:13088:s}  Labs/ tests ordered today include: *** No orders of the defined types were placed in this  encounter.    Disposition:   FU with *** in {gen number 9-60:[id number]}0-10:310397} {Days to years:10300}  Signed, Jakyah Bradby [place], MD  12/13/2018 7:41 AM    [facility] [address], [place], [place], [address] Phone [phone], Fax [phone]

## 2018-12-16 ENCOUNTER — Ambulatory Visit: Payer: Medicaid Other | Admitting: Family Medicine

## 2018-12-17 ENCOUNTER — Ambulatory Visit: Payer: Self-pay | Admitting: Cardiology

## 2019-01-22 ENCOUNTER — Other Ambulatory Visit: Payer: Self-pay

## 2019-01-22 ENCOUNTER — Encounter (HOSPITAL_COMMUNITY): Payer: Self-pay

## 2019-01-22 ENCOUNTER — Emergency Department (HOSPITAL_BASED_OUTPATIENT_CLINIC_OR_DEPARTMENT_OTHER): Payer: Medicaid Other

## 2019-01-22 ENCOUNTER — Emergency Department (HOSPITAL_COMMUNITY): Payer: Medicaid Other

## 2019-01-22 ENCOUNTER — Emergency Department (HOSPITAL_COMMUNITY)
Admission: EM | Admit: 2019-01-22 | Discharge: 2019-01-22 | Disposition: A | Discharge status: Home / Self Care | Payer: Medicaid Other | Attending: Emergency Medicine | Admitting: Emergency Medicine

## 2019-01-22 DIAGNOSIS — I10 Essential (primary) hypertension: Secondary | ICD-10-CM | POA: Insufficient documentation

## 2019-01-22 DIAGNOSIS — R2242 Localized swelling, mass and lump, left lower limb: Secondary | ICD-10-CM | POA: Insufficient documentation

## 2019-01-22 DIAGNOSIS — M7989 Other specified soft tissue disorders: Secondary | ICD-10-CM

## 2019-01-22 DIAGNOSIS — Z79899 Other long term (current) drug therapy: Secondary | ICD-10-CM | POA: Insufficient documentation

## 2019-01-22 DIAGNOSIS — Z4800 Encounter for change or removal of nonsurgical wound dressing: Secondary | ICD-10-CM | POA: Insufficient documentation

## 2019-01-22 DIAGNOSIS — F17210 Nicotine dependence, cigarettes, uncomplicated: Secondary | ICD-10-CM | POA: Insufficient documentation

## 2019-01-22 DIAGNOSIS — T148XXA Other injury of unspecified body region, initial encounter: Secondary | ICD-10-CM

## 2019-01-22 DIAGNOSIS — L089 Local infection of the skin and subcutaneous tissue, unspecified: Secondary | ICD-10-CM

## 2019-01-22 DIAGNOSIS — R609 Edema, unspecified: Secondary | ICD-10-CM

## 2019-01-22 LAB — CBC WITH DIFFERENTIAL/PLATELET
ABS IMMATURE GRANULOCYTES: 0 10*3/uL (ref 0.00–0.07)
BASOS PCT: 1 %
Basophils Absolute: 0.1 10*3/uL (ref 0.0–0.1)
Eosinophils Absolute: 0.5 10*3/uL (ref 0.0–0.5)
Eosinophils Relative: 8 %
HCT: 39.8 % (ref 36.0–46.0)
HEMOGLOBIN: 12 g/dL (ref 12.0–15.0)
Lymphocytes Relative: 28 %
Lymphs Abs: 1.7 10*3/uL (ref 0.7–4.0)
MCH: 26.1 pg (ref 26.0–34.0)
MCHC: 30.2 g/dL (ref 30.0–36.0)
MCV: 86.5 fL (ref 80.0–100.0)
Monocytes Absolute: 0.5 10*3/uL (ref 0.1–1.0)
Monocytes Relative: 9 %
Neutro Abs: 3.2 10*3/uL (ref 1.7–7.7)
Neutrophils Relative %: 54 %
Platelets: 195 10*3/uL (ref 150–400)
RBC: 4.6 MIL/uL (ref 3.87–5.11)
RDW: 14.6 % (ref 11.5–15.5)
WBC: 6 10*3/uL (ref 4.0–10.5)
nRBC: 0 % (ref 0.0–0.2)
nRBC: 0 /100 WBC

## 2019-01-22 LAB — COMPREHENSIVE METABOLIC PANEL
ALT: 10 U/L (ref 0–44)
AST: 16 U/L (ref 15–41)
Albumin: 3.2 g/dL — ABNORMAL LOW (ref 3.5–5.0)
Alkaline Phosphatase: 69 U/L (ref 38–126)
Anion gap: 9 (ref 5–15)
BUN: 13 mg/dL (ref 6–20)
CHLORIDE: 104 mmol/L (ref 98–111)
CO2: 24 mmol/L (ref 22–32)
Calcium: 8.9 mg/dL (ref 8.9–10.3)
Creatinine, Ser: 0.75 mg/dL (ref 0.44–1.00)
GFR calc Af Amer: >60 mL/min (ref >60)
Glucose, Bld: 86 mg/dL (ref 70–99)
Potassium: 3.1 mmol/L — ABNORMAL LOW (ref 3.5–5.1)
Sodium: 137 mmol/L (ref 135–145)
TOTAL PROTEIN: 8.2 g/dL — AB (ref 6.5–8.1)
Total Bilirubin: 0.2 mg/dL — ABNORMAL LOW (ref 0.3–1.2)

## 2019-01-22 LAB — LACTIC ACID, PLASMA: LACTIC ACID, VENOUS: 0.7 mmol/L (ref 0.5–1.9)

## 2019-01-22 LAB — CBG MONITORING, ED: Glucose-Capillary: 71 mg/dL (ref 70–99)

## 2019-01-22 MED ORDER — TRAMADOL HCL 50 MG PO TABS
50.0000 mg | ORAL_TABLET | Freq: Once | ORAL | Status: AC
  Administered 2019-01-22: 50 mg via ORAL
  Filled 2019-01-22: qty 1

## 2019-01-22 MED ORDER — TRAMADOL HCL 50 MG PO TABS: 50.0000 mg | ORAL_TABLET | Freq: Four times a day (QID) | ORAL | 0 refills | Status: DC | PRN

## 2019-01-22 MED ORDER — SODIUM CHLORIDE 0.9% FLUSH: 3.0000 mL | Freq: Once | INTRAVENOUS | Status: DC

## 2019-01-22 MED ORDER — DOXYCYCLINE HYCLATE 100 MG PO CAPS: 100.0000 mg | ORAL_CAPSULE | Freq: Two times a day (BID) | ORAL | 0 refills | Status: DC

## 2019-01-22 MED ORDER — HYDROXYZINE HCL 25 MG PO TABS: 25.0000 mg | ORAL_TABLET | Freq: Four times a day (QID) | ORAL | 0 refills | Status: DC

## 2019-01-22 MED ORDER — DIPHENHYDRAMINE HCL 25 MG PO CAPS
50.0000 mg | ORAL_CAPSULE | Freq: Once | ORAL | Status: AC
  Administered 2019-01-22: 50 mg via ORAL
  Filled 2019-01-22: qty 2

## 2019-01-22 MED ORDER — DOXYCYCLINE HYCLATE 100 MG PO TABS
100.0000 mg | ORAL_TABLET | Freq: Once | ORAL | Status: AC
  Administered 2019-01-22: 100 mg via ORAL
  Filled 2019-01-22: qty 1

## 2019-01-22 MED ORDER — NAPROXEN 250 MG PO TABS
500.0000 mg | ORAL_TABLET | Freq: Once | ORAL | Status: AC
  Administered 2019-01-22: 500 mg via ORAL
  Filled 2019-01-22: qty 2

## 2019-01-22 MED ORDER — ACETAMINOPHEN 500 MG PO TABS
1000.0000 mg | ORAL_TABLET | Freq: Once | ORAL | Status: AC
  Administered 2019-01-22: 1000 mg via ORAL
  Filled 2019-01-22: qty 2

## 2019-01-22 NOTE — ED Triage Notes (Signed)
Pt reports wound to her left foot X1 year, non healing. Pt states the wound has now spread up to her left leg/calf area. Swelling noted, puss like drainage noted.

## 2019-01-22 NOTE — Discharge Instructions (Signed)
You were seen in the ER for infection of your wound.  There is no signs of infection in your blood or bone or deeper tissues.  Take doxycycline as prescribed.  As we discussed, you need close follow-up with wound clinic.  I have put an order for ambulatory referral.  If you do not hear back from them in the next 2-3 business days, call them and seek an appointment.  In the meantime, keep your wound clean.  Use clean water to rinse and pat dry.  Do not rub or itch.  Put a thin layer of Vaseline to the edges of the wound and keep covered.  For pain you can take 500 to 1000 mg of acetaminophen every 6-8 hours.  Tramadol for breakthrough more severe pain.  Hydroxyzine for associated itching.  Return to the ER if there is any fevers, chills, worsening redness drainage or swelling to the leg despite 48 hours of antibiotics.  Ultimately, you need to be seen by rheumatology for further discussion of your pyoderma.

## 2019-01-22 NOTE — Progress Notes (Signed)
Lower extremity venous has been completed.   Preliminary results in CV Proc.   Blanch Media 01/22/2019 7:00 PM

## 2019-01-22 NOTE — ED Provider Notes (Signed)
[facility] EMERGENCY DEPARTMENT Provider Note   CSN: [id number] Arrival date & time: 01/22/19  1519    History   Chief Complaint Chief Complaint  Patient presents with  . Wound Check    HPI Cheryl Mckinney is a 53 y.o. 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  Reports over the last 2 to 3 weeks her wound has been enlarging, now covering a larger surface area.  Over the last 2 days there has been increased redness, warmth, swelling and increased pain.  Has also noticed yellow malodorous drainage from it.  She has had superimposed infections of the wound in the past and states today her symptoms are similar.  Her pain is moderate to severe, pulsating, sharp, localized.  She was last treated with Augmentin in January for a superimposed infection.  Usually patient uses water to rinse the wound and applies a moderate amount of Vaseline and keep the wound covered.  The pain is worse when she has the wound uncovered and air is blowing past it.  She she admits sometimes itching is so bad that she rubs the wound forcefully when she is cleaning it.  Has taken ibuprofen and Aleve without relief.  She denies any fever, chills, loss of sensation distally, paresthesias.  No recent risks for PE/DVT, no estrogen use, prolonged travel/surgeries/immobilization, CP, SOB.   HPI  Past Medical History:  Diagnosis Date  . DVT (deep venous thrombosis) (HCC) 2016  . Hypertension     Patient Active Problem List   Diagnosis Date Noted  . Essential hypertension 10/09/2018  . Leg wound, left 10/09/2018  . Acute bronchitis   . Hypokalemia 10/08/2018    Past Surgical History:  Procedure Laterality Date  . CESAREAN SECTION     x5  . TUBAL LIGATION       OB History   No obstetric history on file.      Home Medications    Prior to Admission  medications   Medication Sig Start Date End Date Taking? Authorizing Provider  amLODipine (NORVASC) 10 MG tablet Take 1 tablet (10 mg total) by mouth daily. 10/29/18  Yes Georgian Co M, PA-C  diltiazem (CARDIZEM) 30 MG tablet Take 2 tablets (60 mg total) by mouth 3 (three) times daily. 12/03/18  Yes Azalia Bilis, MD  ibuprofen (ADVIL,MOTRIN) 200 MG tablet Take 600-800 mg by mouth every 4 (four) hours as needed (pain).   Yes [provider]  naproxen sodium (ALEVE) 220 MG tablet Take 440 mg by mouth 2 (two) times daily as needed (pain).   Yes [provider]  potassium chloride SA (K-DUR,KLOR-CON) 20 MEQ tablet Take 1 tablet (20 mEq total) by mouth daily. 12/03/18  Yes Azalia Bilis, MD  doxycycline (VIBRAMYCIN) 100 MG capsule Take 1 capsule (100 mg total) by mouth 2 (two) times daily. 01/22/19   Liberty Handy, PA-C  hydrOXYzine (ATARAX/VISTARIL) 25 MG tablet Take 1 tablet (25 mg total) by mouth every 6 (six) hours. 01/22/19   Liberty Handy, PA-C  traMADol (ULTRAM) 50 MG tablet Take 1 tablet (50 mg total) by mouth every 6 (six) hours as needed. 01/22/19   Liberty Handy, PA-C  Zinc Oxide (TRIPLE PASTE) 12.8 % ointment Apply 1 application topically as needed for irritation. Patient not taking: Reported on 12/03/2018 07/17/18   Claiborne Rigg, NP    Family History Family  History  Problem Relation Age of Onset  . Hypertension Mother   . Diabetes Mother     Social History Social History   Tobacco Use  . Smoking status: Current Every Day Smoker    Packs/day: 0.50    Types: Cigarettes  . Smokeless tobacco: Never Used  Substance Use Topics  . Alcohol use: Not Currently    Comment: once-twice weeks.   . Drug use: Yes    Types: Marijuana     Allergies   Patient has no known allergies.   Review of Systems Review of Systems  Skin: Positive for color change and wound.       Drainage, redness, warmth  All other systems reviewed and are  negative.    Physical Exam Updated Vital Signs BP (!) 191/100   Pulse 77   Temp 97.8 F (36.6 C) (Oral)   Resp 18   SpO2 100%   Physical Exam Vitals signs and nursing note reviewed.  Constitutional:      General: She is not in acute distress.    Appearance: She is well-developed.     Comments: NAD.  HENT:     Head: Normocephalic and atraumatic.     Right Ear: External ear normal.     Left Ear: External ear normal.     Nose: Nose normal.  Eyes:     General: No scleral icterus.    Conjunctiva/sclera: Conjunctivae normal.  Neck:     Musculoskeletal: Normal range of motion and neck supple.  Cardiovascular:     Rate and Rhythm: Normal rate and regular rhythm.     Heart sounds: Normal heart sounds. No murmur.     Comments: Palpable but weak DP and PT pulses in the left leg secondary to moderate mid calf and ankle edema.  Brisk cap refill to the toes.  No focal popliteal, mid calf tenderness. Negative Thompson's Pulmonary:     Effort: Pulmonary effort is normal.     Breath sounds: Normal breath sounds. No wheezing.  Musculoskeletal: Normal range of motion.        General: No deformity.  Skin:    General: Skin is warm and dry.     Capillary Refill: Capillary refill takes less than 2 seconds.     Comments: Macerated, malodorous wound to the left lateral leg.  Surrounding skin is also macerated.  Medially, skin is starting to breakdown.  Yellow runny drainage to the dressings noted.  No significant warmth to the extremity.  Moderate edema distally to the ankle, midfoot and along the mid calf.  Neurological:     Mental Status: She is alert and oriented to person, place, and time.     Comments: Sensation to light touch in left foot. Strength intact in ankle.   Psychiatric:        Behavior: Behavior normal.        Thought Content: Thought content normal.        Judgment: Judgment normal.          ED Treatments / Results  Labs (all labs ordered are listed, but only  abnormal results are displayed) Labs Reviewed  COMPREHENSIVE METABOLIC PANEL - Abnormal; Notable for the following components:      Result Value   Potassium 3.1 (*)    Total Protein 8.2 (*)    Albumin 3.2 (*)    Total Bilirubin 0.2 (*)    All other components within normal limits  LACTIC ACID, PLASMA  CBC WITH DIFFERENTIAL/PLATELET  CBG MONITORING, ED  EKG None  Radiology Dg Tibia/fibula Left  Result Date: 01/22/2019 CLINICAL DATA:  Nonhealing wound in the left foot and ankle. EXAM: LEFT TIBIA AND FIBULA - 2 VIEW COMPARISON:  Left tibia and fibula radiographs 05/18/2018 FINDINGS: Extensive soft tissue swelling and edema is present throughout the visualized lower extremity. There is no underlying fracture or foreign body. Calcaneal spurs are noted. IMPRESSION: Progressive edema in the left lower extremity. No acute or focal osseous abnormality. Electronically Signed   By: Marin Robertshristopher  Mattern M.D.   On: 01/22/2019 16:41   Vas [place] Lower Extremity Venous (dvt) (mc And Wl 7a-7p)  Result Date: 01/22/2019  Lower Venous Study Indications: Swelling, and Edema.  Limitations: Body habitus and poor ultrasound/tissue interface. Performing Technologist: Blanch MediaMegan Riddle RVS  Examination Guidelines: A complete evaluation includes B-mode imaging, spectral Doppler, color Doppler, and power Doppler as needed of all accessible portions of each vessel. Bilateral testing is considered an integral part of a complete examination. Limited examinations for reoccurring indications may be performed as noted.  Left Venous Findings: +---------+---------------+---------+-----------+----------+--------------+          CompressibilityPhasicitySpontaneityPropertiesSummary        +---------+---------------+---------+-----------+----------+--------------+ CFV      Full           Yes      Yes                                 +---------+---------------+---------+-----------+----------+--------------+ SFJ      Full                                                         +---------+---------------+---------+-----------+----------+--------------+ FV Prox  Full                                                        +---------+---------------+---------+-----------+----------+--------------+ FV Mid   Full                                                        +---------+---------------+---------+-----------+----------+--------------+ FV DistalFull                                                        +---------+---------------+---------+-----------+----------+--------------+ PFV      Full                                                        +---------+---------------+---------+-----------+----------+--------------+ POP      Full           Yes      Yes                                 +---------+---------------+---------+-----------+----------+--------------+  PTV                                                   Not visualized +---------+---------------+---------+-----------+----------+--------------+ PERO                                                  Not visualized +---------+---------------+---------+-----------+----------+--------------+    Summary: Left: There is no evidence of deep vein thrombosis in the lower extremity. However, portions of this examination were limited- see technologist comments above. No cystic structure found in the popliteal fossa.  *See table(s) above for measurements and observations.    Preliminary     Procedures Procedures (including critical care time)  Medications Ordered in ED Medications  sodium chloride flush (NS) 0.9 % injection 3 mL (has no administration in time range)  diphenhydrAMINE (BENADRYL) capsule 50 mg (50 mg Oral Given 01/22/19 1855)  traMADol (ULTRAM) tablet 50 mg (50 mg Oral Given 01/22/19 1855)  doxycycline (VIBRA-TABS) tablet 100 mg (100 mg Oral Given 01/22/19 2010)  naproxen (NAPROSYN) tablet 500 mg (500 mg Oral Given  01/22/19 2044)  acetaminophen (TYLENOL) tablet 1,000 mg (1,000 mg Oral Given 01/22/19 2044)     Initial Impression / Assessment and Plan / ED Course  I have reviewed the triage vital signs and the nursing notes.  Pertinent labs & imaging results that were available during my care of the patient were reviewed by me and considered in my medical decision making (see chart for details).  Clinical Course as of Jan 21 2046  Fri Jan 22, 2019  1828 IMPRESSION: Progressive edema in the left lower extremity. No acute or focal osseous abnormality.  DG Tibia/Fibula Left [CG]    Clinical Course User Index [CG] Liberty Handy, PA-C       Chronic wound with what appears to be superimposed infection.  No leukocytosis, lactic acidosis.  Creatinine normal.  Swelling is unilateral mostly around the wound.  Patient has history of left lower extremity DVT 2016 after anticoagulation.  She has no focal popliteal or calf tenderness.  No recent risk factors for recurrent DVT, chest pain, shortness of breath.  There is no tachycardia, tachypnea.  She does report however worsening swelling to the ankle for the last 2 days.  Given her previous DVT history, vascular ultrasound today was performed which was negative.  Certain areas of the vasculature were not well visualized due to focal edema but overall her presentation is most consistent with an infectious process and less likely a DVT.  X-ray shows soft tissue edema but no acute osseous abnormality or obvious osteomyelitis.  Wound was cleansed and dressed in the ER.  She will be discharged with doxycycline, tramadol and NSAIDs.  I have placed an order for ambulatory referral to wound clinic.  Patient is aware that if she does not hear from them she needs to call them to make an appointment.  Per PCP notes, she has been advised to follow-up with wound clinic and rheumatology but has not done so yet.  I encouraged this.  Return precautions were given.  Patient is in  agreement with this.  Final Clinical Impressions(s) / ED Diagnoses   Final diagnoses:  Wound infection  Left leg  swelling    ED Discharge Orders         Ordered    Apply dressing     01/22/19 1940    doxycycline (VIBRAMYCIN) 100 MG capsule  2 times daily     01/22/19 2030    traMADol (ULTRAM) 50 MG tablet  Every 6 hours PRN     01/22/19 2030    hydrOXYzine (ATARAX/VISTARIL) 25 MG tablet  Every 6 hours     01/22/19 2030    Ambulatory referral to Wound Clinic     01/22/19 2043           Jerrell Mylar 01/22/19 2047    Loren Racer, MD 01/29/19 [phone]

## 2019-01-25 MED FILL — traMADol HCL 50 MG TABS: 50 | 3 days supply | Qty: 15 | Fill #0

## 2019-01-25 MED FILL — hydrOXYzine HCL 25 MG TABS: 25 | 3 days supply | Qty: 12 | Fill #0

## 2019-01-25 MED FILL — DOXYCYCLINE HYCLATE 100 MG: 100 | 10 days supply | Qty: 20 | Fill #0

## 2019-01-27 NOTE — Progress Notes (Deleted)
Patient ID: Cheryl Mckinney, female   DOB: Jan 10, 1966, 53 y.o.   MRN: [id number]   After being seen in the ED 01/22/2019.   From ED note: [name] is a 53 y.o. 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  Reports over the last 2 to 3 weeks her wound has been enlarging, now covering a larger surface area.  Over the last 2 days there has been increased redness, warmth, swelling and increased pain.  Has also noticed yellow malodorous drainage from it.  She has had superimposed infections of the wound in the past and states today her symptoms are similar.  Her pain is moderate to severe, pulsating, sharp, localized.  She was last treated with Augmentin in January for a superimposed infection.  Usually patient uses water to rinse the wound and applies a moderate amount of Vaseline and keep the wound covered.  The pain is worse when she has the wound uncovered and air is blowing past it.  She she admits sometimes itching is so bad that she rubs the wound forcefully when she is cleaning it.  Has taken ibuprofen and Aleve without relief.  She denies any fever, chills, loss of sensation distally, paresthesias.  No recent risks for PE/DVT, no estrogen use, prolonged travel/surgeries/immobilization, CP, SOB.  From A/P: Chronic wound with what appears to be superimposed infection.  No leukocytosis, lactic acidosis.  Creatinine normal.  Swelling is unilateral mostly around the wound.  Patient has history of left lower extremity DVT 2016 after anticoagulation.  She has no focal popliteal or calf tenderness.  No recent risk factors for recurrent DVT, chest pain, shortness of breath.  There is no tachycardia, tachypnea.  She does report however worsening swelling to the ankle for the last 2 days.  Given her previous DVT history, vascular ultrasound today was performed which was  negative.  Certain areas of the vasculature were not well visualized due to focal edema but overall her presentation is most consistent with an infectious process and less likely a DVT.  X-ray shows soft tissue edema but no acute osseous abnormality or obvious osteomyelitis.  Wound was cleansed and dressed in the ER.  She will be discharged with doxycycline, tramadol and NSAIDs.  I have placed an order for ambulatory referral to wound clinic.  Patient is aware that if she does not hear from them she needs to call them to make an appointment.  Per PCP notes, she has been advised to follow-up with wound clinic and rheumatology but has not done so yet.

## 2019-01-28 ENCOUNTER — Inpatient Hospital Stay: Payer: Medicaid Other

## 2019-01-30 NOTE — Progress Notes (Deleted)
Cardiology Office Note   Date:  01/30/2019   ID:  Cheryl Mckinney, DOB 1966/01/26, MRN [id number]  PCP:  Cain Saupe, MD  Cardiologist:   Brookie Wayment [place], MD   No chief complaint on file.     History of Present Illness: Cheryl Mckinney is a 53 y.o. female who is seen at the request of Dr. Jillyn Hidden for evaluation of SVT. She has a history of HTN and prior DVT. She was seen in the ED on December 03, 2018 with abrupt onset of dizziness and palpitations. Was in SVT with rate 193. Converted with carotid massage and vagal maneuvers. Was severely hypokalemic at 2.8. potassium repleted and she was DC on diltiazem.    Past Medical History:  Diagnosis Date  . DVT (deep venous thrombosis) (HCC) 2016  . Hypertension     Past Surgical History:  Procedure Laterality Date  . CESAREAN SECTION     x5  . TUBAL LIGATION       Current Outpatient Medications  Medication Sig Dispense Refill  . amLODipine (NORVASC) 10 MG tablet Take 1 tablet (10 mg total) by mouth daily. 90 tablet 3  . diltiazem (CARDIZEM) 30 MG tablet Take 2 tablets (60 mg total) by mouth 3 (three) times daily. 60 tablet 0  . doxycycline (VIBRAMYCIN) 100 MG capsule Take 1 capsule (100 mg total) by mouth 2 (two) times daily. 20 capsule 0  . hydrOXYzine (ATARAX/VISTARIL) 25 MG tablet Take 1 tablet (25 mg total) by mouth every 6 (six) hours. 12 tablet 0  . ibuprofen (ADVIL,MOTRIN) 200 MG tablet Take 600-800 mg by mouth every 4 (four) hours as needed (pain).    . naproxen sodium (ALEVE) 220 MG tablet Take 440 mg by mouth 2 (two) times daily as needed (pain).    . potassium chloride SA (K-DUR,KLOR-CON) 20 MEQ tablet Take 1 tablet (20 mEq total) by mouth daily. 10 tablet 0  . traMADol (ULTRAM) 50 MG tablet Take 1 tablet (50 mg total) by mouth every 6 (six) hours as needed. 15 tablet 0  . Zinc Oxide (TRIPLE PASTE) 12.8 % ointment Apply 1 application topically as needed for irritation. (Patient not taking: Reported on 12/03/2018) 56.7 g 1    Current Facility-Administered Medications  Medication Dose Route Frequency Provider Last Rate Last Dose  . ipratropium-albuterol (DUONEB) 0.5-2.5 (3) MG/3ML nebulizer solution 3 mL  3 mL Nebulization Once Fulp, Cammie, MD        Allergies:   Patient has no known allergies.    Social History:  The patient  reports that she has been smoking cigarettes. She has been smoking about 0.50 packs per day. She has never used smokeless tobacco. She reports previous alcohol use. She reports current drug use. Drug: Marijuana.   Family History:  The patient's ***family history includes Diabetes in her mother; Hypertension in her mother.    ROS:  Please see the history of present illness.   Otherwise, review of systems are positive for {NONE DEFAULTED:18576::"none"}.   All other systems are reviewed and negative.    PHYSICAL EXAM: VS:  There were no vitals taken for this visit. , BMI There is no height or weight on file to calculate BMI. GEN: Well nourished, well developed, in no acute distress  HEENT: normal  Neck: no JVD, carotid bruits, or masses Cardiac: ***RRR; no murmurs, rubs, or gallops,no edema  Respiratory:  clear to auscultation bilaterally, normal work of breathing GI: soft, nontender, nondistended, + BS MS: no deformity or atrophy  Skin:  warm and dry, no rash Neuro:  Strength and sensation are intact Psych: euthymic mood, full affect   EKG:  EKG {ACTION; IS/IS LHT:34287681} ordered today. The ekg ordered today demonstrates ***   Recent Labs: 12/03/2018: Magnesium 2.0; TSH 2.885 01/22/2019: ALT 10; BUN 13; Creatinine, Ser 0.75; Hemoglobin 12.0; Platelets 195; Potassium 3.1; Sodium 137    Lipid Panel No results found for: CHOL, TRIG, HDL, CHOLHDL, VLDL, LDLCALC, LDLDIRECT    Wt Readings from Last 3 Encounters:  10/29/18 224 lb (101.6 kg)  10/09/18 212 lb 11.9 oz (96.5 kg)  10/06/18 203 lb (92.1 kg)      Other studies Reviewed: Additional studies/ records that were  reviewed today include: ***. Review of the above records demonstrates: ***   ASSESSMENT AND PLAN:  1.  ***   Current medicines are reviewed at length with the patient today.  The patient {ACTIONS; HAS/DOES NOT HAVE:19233} concerns regarding medicines.  The following changes have been made:  {PLAN; NO CHANGE:13088:s}  Labs/ tests ordered today include: *** No orders of the defined types were placed in this encounter.    Disposition:   FU with *** in {gen number 1-57:262035} {Days to years:10300}  Signed, Mashanda Ishibashi [place], MD  01/30/2019 6:57 AM    [facility] [address], [place], [place], [address] Phone [phone], Fax [phone]

## 2019-02-01 ENCOUNTER — Ambulatory Visit: Payer: Self-pay | Admitting: Cardiology

## 2019-02-04 ENCOUNTER — Other Ambulatory Visit: Payer: Self-pay

## 2019-02-04 ENCOUNTER — Encounter (HOSPITAL_BASED_OUTPATIENT_CLINIC_OR_DEPARTMENT_OTHER): Payer: Medicaid Other | Attending: Internal Medicine

## 2019-02-04 DIAGNOSIS — Z86718 Personal history of other venous thrombosis and embolism: Secondary | ICD-10-CM | POA: Insufficient documentation

## 2019-02-04 DIAGNOSIS — I890 Lymphedema, not elsewhere classified: Secondary | ICD-10-CM | POA: Insufficient documentation

## 2019-02-04 DIAGNOSIS — I10 Essential (primary) hypertension: Secondary | ICD-10-CM | POA: Insufficient documentation

## 2019-02-04 DIAGNOSIS — F17210 Nicotine dependence, cigarettes, uncomplicated: Secondary | ICD-10-CM | POA: Insufficient documentation

## 2019-02-04 DIAGNOSIS — L97822 Non-pressure chronic ulcer of other part of left lower leg with fat layer exposed: Secondary | ICD-10-CM | POA: Insufficient documentation

## 2019-02-10 ENCOUNTER — Other Ambulatory Visit (HOSPITAL_COMMUNITY)
Admission: RE | Admit: 2019-02-10 | Discharge: 2019-02-10 | Disposition: A | Discharge status: Home / Self Care | Payer: Medicaid Other | Source: Other Acute Inpatient Hospital | Attending: Physician Assistant | Admitting: Physician Assistant

## 2019-02-10 DIAGNOSIS — Z833 Family history of diabetes mellitus: Secondary | ICD-10-CM | POA: Insufficient documentation

## 2019-02-10 DIAGNOSIS — Z791 Long term (current) use of non-steroidal anti-inflammatories (NSAID): Secondary | ICD-10-CM | POA: Insufficient documentation

## 2019-02-10 DIAGNOSIS — Z79899 Other long term (current) drug therapy: Secondary | ICD-10-CM | POA: Insufficient documentation

## 2019-02-10 DIAGNOSIS — Z8249 Family history of ischemic heart disease and other diseases of the circulatory system: Secondary | ICD-10-CM | POA: Insufficient documentation

## 2019-02-10 DIAGNOSIS — E876 Hypokalemia: Secondary | ICD-10-CM | POA: Insufficient documentation

## 2019-02-10 DIAGNOSIS — F17210 Nicotine dependence, cigarettes, uncomplicated: Secondary | ICD-10-CM | POA: Insufficient documentation

## 2019-02-10 DIAGNOSIS — L97829 Non-pressure chronic ulcer of other part of left lower leg with unspecified severity: Secondary | ICD-10-CM | POA: Insufficient documentation

## 2019-02-10 DIAGNOSIS — I10 Essential (primary) hypertension: Secondary | ICD-10-CM | POA: Insufficient documentation

## 2019-02-10 MED FILL — levoFLOXacin 500 MG TABS: 500 | 14 days supply | Qty: 28 | Fill #0

## 2019-02-12 LAB — AEROBIC CULTURE W GRAM STAIN (SUPERFICIAL SPECIMEN)

## 2019-02-12 LAB — AEROBIC CULTURE  (SUPERFICIAL SPECIMEN)

## 2019-02-17 ENCOUNTER — Encounter (HOSPITAL_BASED_OUTPATIENT_CLINIC_OR_DEPARTMENT_OTHER): Payer: Medicaid Other | Attending: Physician Assistant

## 2019-02-17 ENCOUNTER — Other Ambulatory Visit: Payer: Self-pay

## 2019-02-17 DIAGNOSIS — Z86718 Personal history of other venous thrombosis and embolism: Secondary | ICD-10-CM | POA: Insufficient documentation

## 2019-02-17 DIAGNOSIS — L97822 Non-pressure chronic ulcer of other part of left lower leg with fat layer exposed: Secondary | ICD-10-CM | POA: Insufficient documentation

## 2019-02-17 DIAGNOSIS — I10 Essential (primary) hypertension: Secondary | ICD-10-CM | POA: Insufficient documentation

## 2019-02-17 DIAGNOSIS — F17210 Nicotine dependence, cigarettes, uncomplicated: Secondary | ICD-10-CM | POA: Insufficient documentation

## 2019-03-23 ENCOUNTER — Encounter (HOSPITAL_BASED_OUTPATIENT_CLINIC_OR_DEPARTMENT_OTHER): Payer: Medicaid Other | Attending: Internal Medicine

## 2019-03-23 DIAGNOSIS — L97822 Non-pressure chronic ulcer of other part of left lower leg with fat layer exposed: Secondary | ICD-10-CM | POA: Insufficient documentation

## 2019-03-23 DIAGNOSIS — F17210 Nicotine dependence, cigarettes, uncomplicated: Secondary | ICD-10-CM | POA: Insufficient documentation

## 2019-03-23 DIAGNOSIS — I890 Lymphedema, not elsewhere classified: Secondary | ICD-10-CM | POA: Insufficient documentation

## 2019-03-23 DIAGNOSIS — Z86718 Personal history of other venous thrombosis and embolism: Secondary | ICD-10-CM | POA: Insufficient documentation

## 2019-03-23 DIAGNOSIS — I10 Essential (primary) hypertension: Secondary | ICD-10-CM | POA: Insufficient documentation

## 2019-04-21 ENCOUNTER — Encounter (HOSPITAL_BASED_OUTPATIENT_CLINIC_OR_DEPARTMENT_OTHER): Payer: Medicaid Other | Attending: Physician Assistant

## 2019-04-21 DIAGNOSIS — L97822 Non-pressure chronic ulcer of other part of left lower leg with fat layer exposed: Secondary | ICD-10-CM | POA: Insufficient documentation

## 2019-04-21 DIAGNOSIS — I10 Essential (primary) hypertension: Secondary | ICD-10-CM | POA: Insufficient documentation

## 2019-04-21 DIAGNOSIS — Z86718 Personal history of other venous thrombosis and embolism: Secondary | ICD-10-CM | POA: Insufficient documentation

## 2019-04-21 DIAGNOSIS — I890 Lymphedema, not elsewhere classified: Secondary | ICD-10-CM | POA: Insufficient documentation

## 2019-04-21 DIAGNOSIS — F17210 Nicotine dependence, cigarettes, uncomplicated: Secondary | ICD-10-CM | POA: Insufficient documentation

## 2019-05-19 ENCOUNTER — Encounter (HOSPITAL_BASED_OUTPATIENT_CLINIC_OR_DEPARTMENT_OTHER): Payer: Medicaid Other | Attending: Physician Assistant

## 2019-05-19 DIAGNOSIS — I890 Lymphedema, not elsewhere classified: Secondary | ICD-10-CM | POA: Insufficient documentation

## 2019-05-19 DIAGNOSIS — L97822 Non-pressure chronic ulcer of other part of left lower leg with fat layer exposed: Secondary | ICD-10-CM | POA: Insufficient documentation

## 2019-05-19 IMAGING — CR DG TIBIA/FIBULA 2V*L*
4 series · 4 of 4 positions shown · non-contrast
Comparison: Left tibia and fibula radiographs 05/18/2018

CLINICAL DATA: Nonhealing wound in the left foot and ankle.

EXAM:
LEFT TIBIA AND FIBULA - 2 VIEW

[tibia ap (1 of 2)]
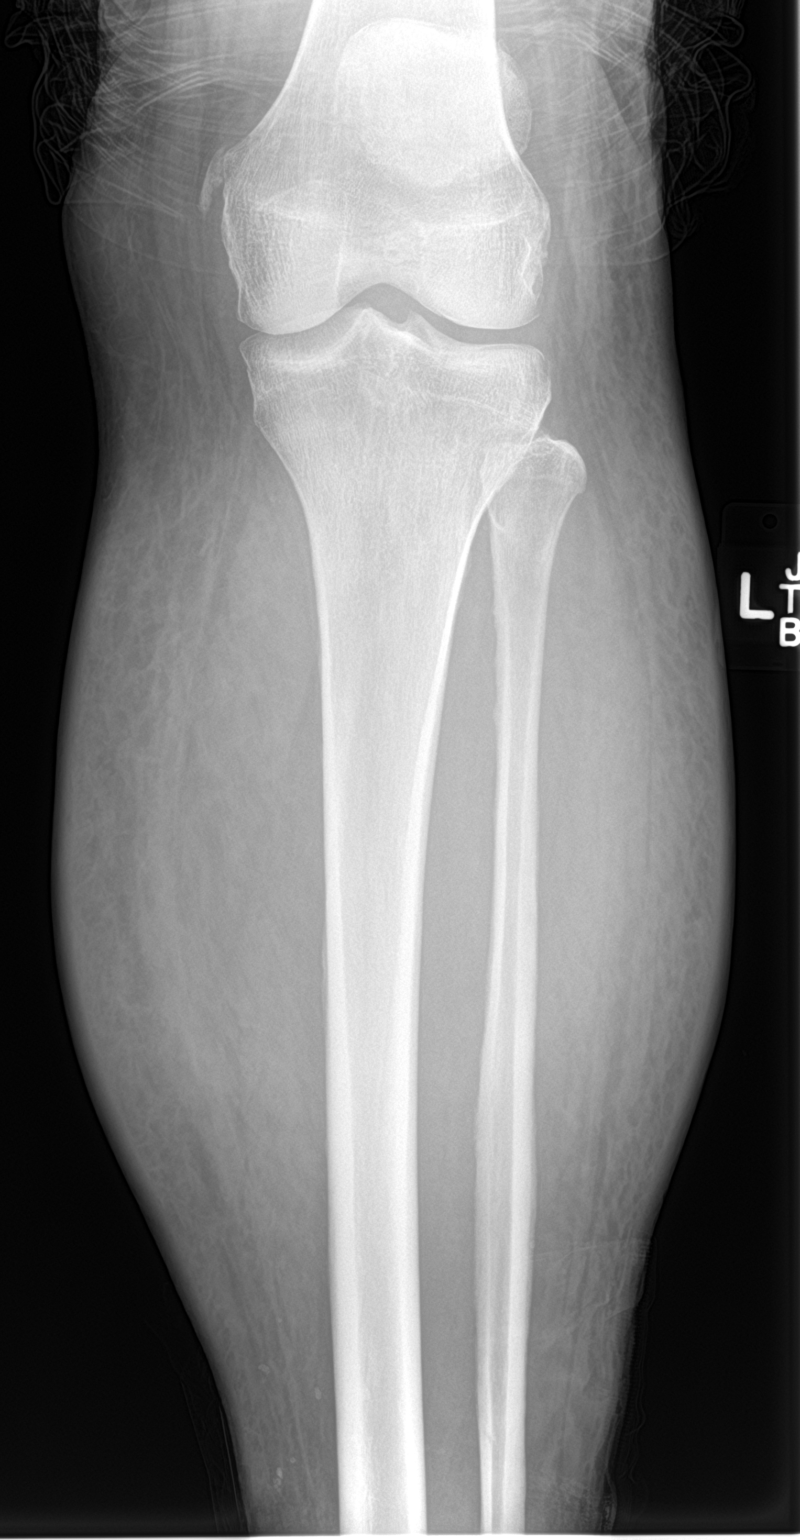

[tibia ap (2 of 2)]
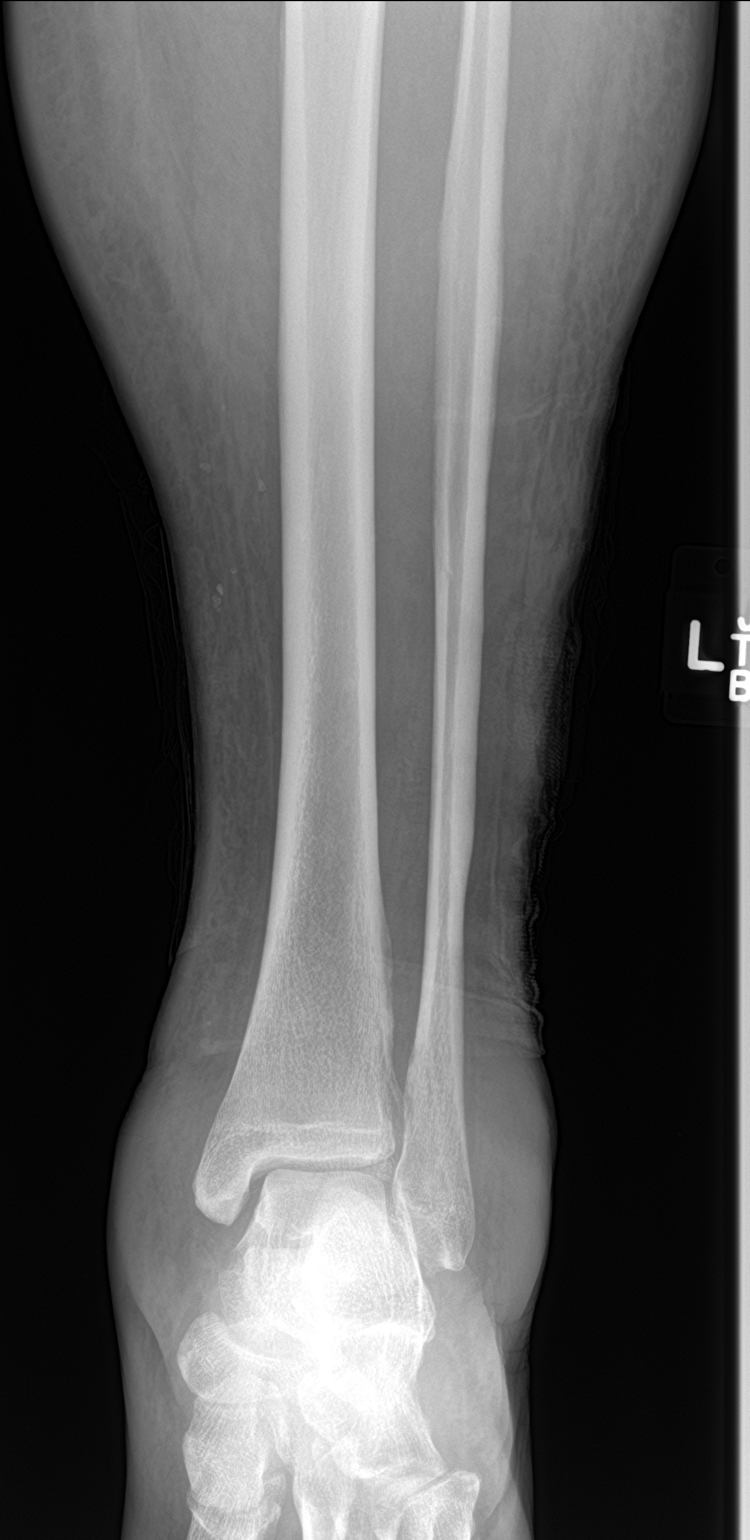

[tibia lat (1 of 2)]
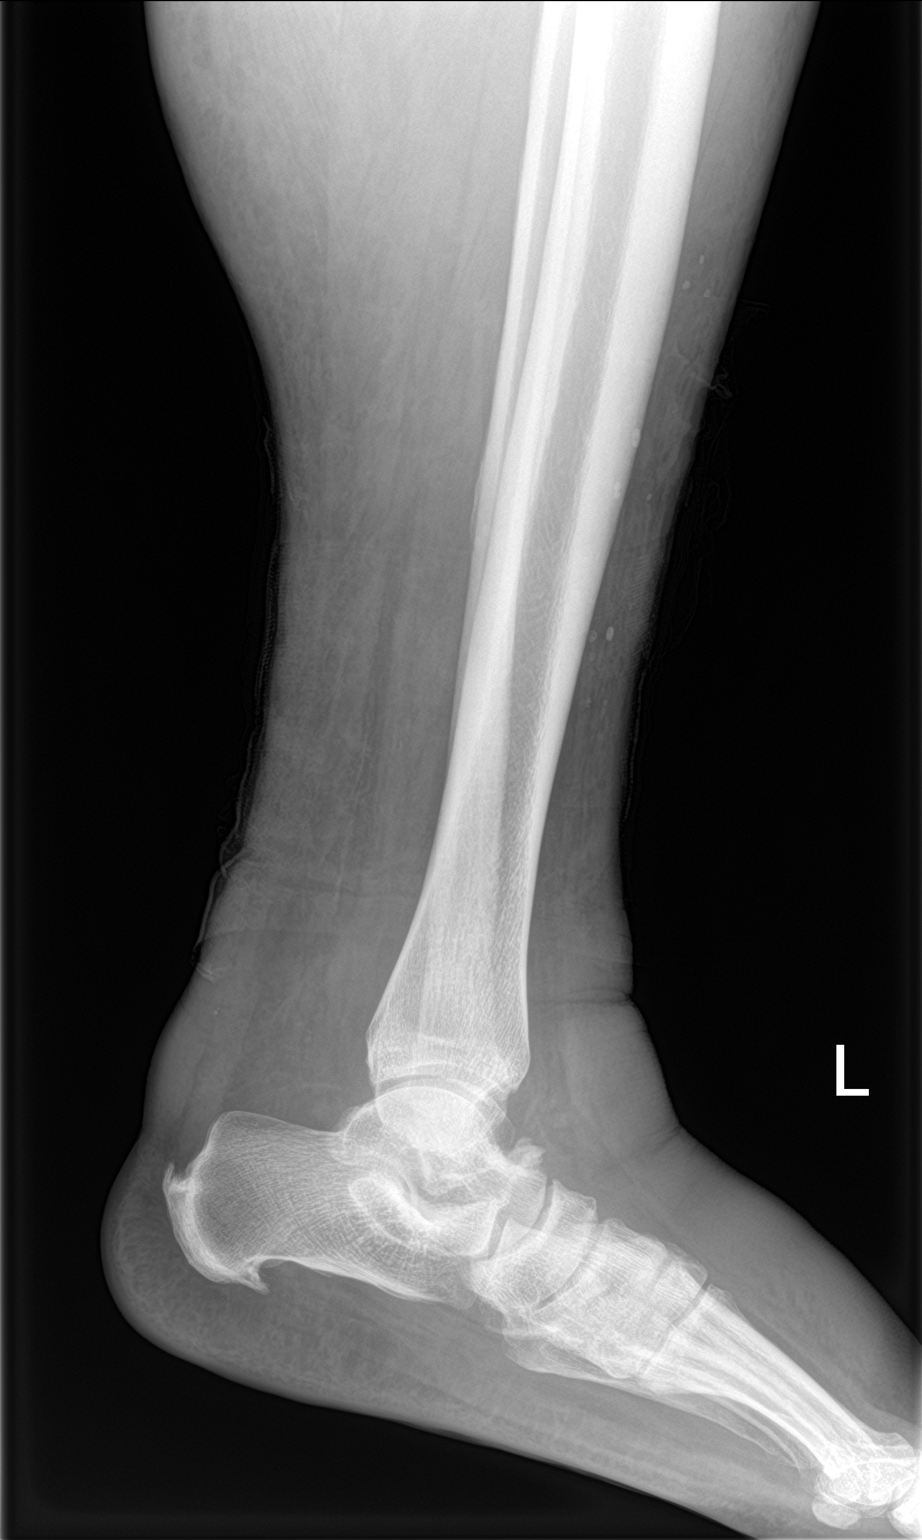

[tibia lat (2 of 2)]
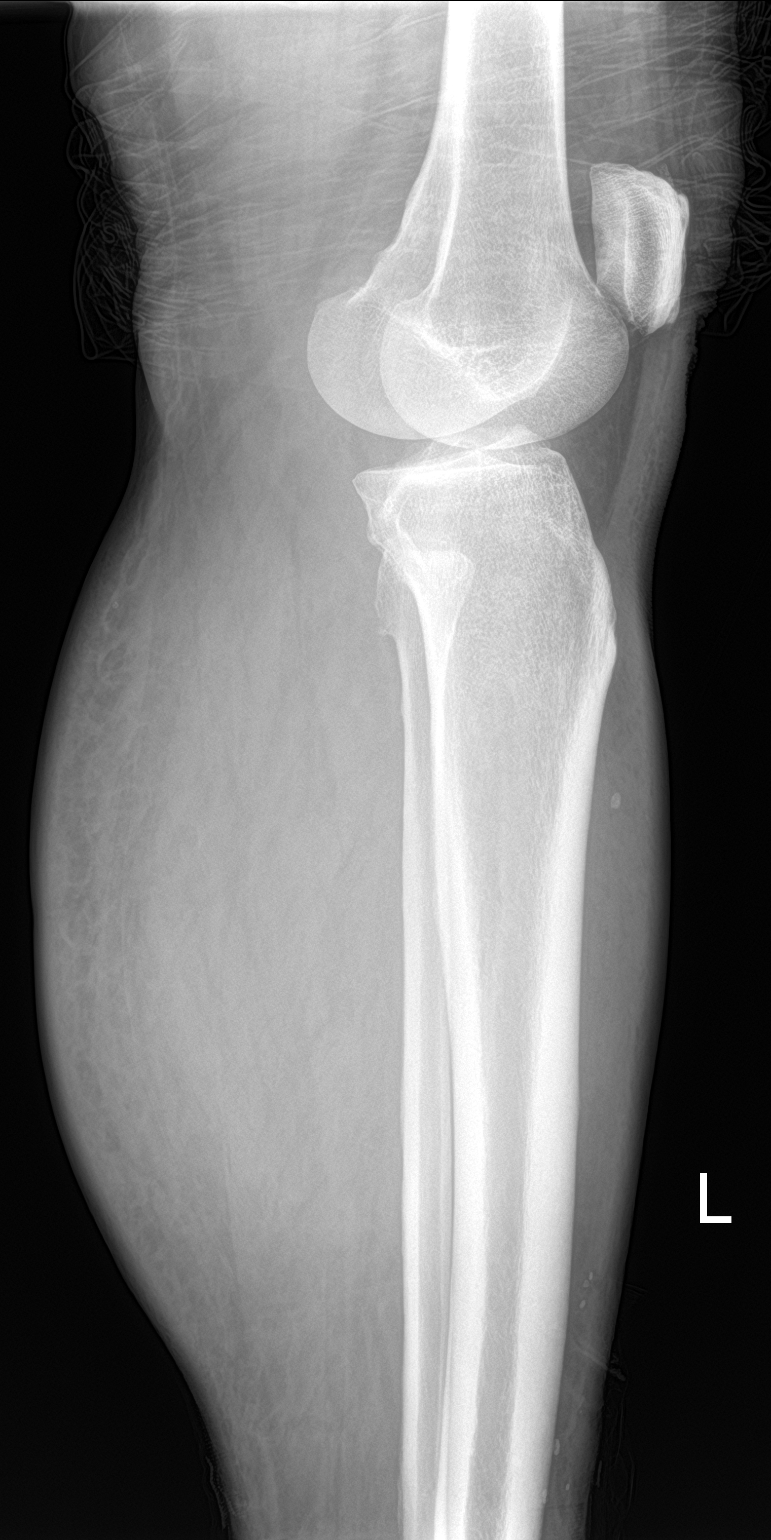

[4 of 4 positions shown; findings below may reference images not displayed]

FINDINGS: Extensive soft tissue swelling and edema is present throughout the
visualized lower extremity. There is no underlying fracture or
foreign body. Calcaneal spurs are noted.
IMPRESSION: Progressive edema in the left lower extremity. No acute or focal
osseous abnormality.

## 2019-06-23 ENCOUNTER — Encounter (HOSPITAL_BASED_OUTPATIENT_CLINIC_OR_DEPARTMENT_OTHER): Payer: Medicaid Other | Attending: Internal Medicine

## 2019-06-23 DIAGNOSIS — I10 Essential (primary) hypertension: Secondary | ICD-10-CM | POA: Insufficient documentation

## 2019-06-23 DIAGNOSIS — Z86718 Personal history of other venous thrombosis and embolism: Secondary | ICD-10-CM | POA: Insufficient documentation

## 2019-06-23 DIAGNOSIS — F17210 Nicotine dependence, cigarettes, uncomplicated: Secondary | ICD-10-CM | POA: Insufficient documentation

## 2019-06-23 DIAGNOSIS — L97822 Non-pressure chronic ulcer of other part of left lower leg with fat layer exposed: Secondary | ICD-10-CM | POA: Insufficient documentation

## 2019-06-23 DIAGNOSIS — I890 Lymphedema, not elsewhere classified: Secondary | ICD-10-CM | POA: Insufficient documentation

## 2019-07-19 ENCOUNTER — Other Ambulatory Visit: Payer: Self-pay | Admitting: Physician Assistant

## 2019-07-19 DIAGNOSIS — L97829 Non-pressure chronic ulcer of other part of left lower leg with unspecified severity: Secondary | ICD-10-CM

## 2019-07-19 DIAGNOSIS — M7989 Other specified soft tissue disorders: Secondary | ICD-10-CM

## 2019-07-21 ENCOUNTER — Encounter: Payer: Self-pay | Admitting: Critical Care Medicine

## 2019-07-21 ENCOUNTER — Encounter (HOSPITAL_BASED_OUTPATIENT_CLINIC_OR_DEPARTMENT_OTHER): Payer: Medicaid Other | Attending: Physician Assistant

## 2019-07-21 ENCOUNTER — Ambulatory Visit: Payer: Self-pay | Attending: Critical Care Medicine | Admitting: Critical Care Medicine

## 2019-07-21 ENCOUNTER — Other Ambulatory Visit: Payer: Self-pay

## 2019-07-21 ENCOUNTER — Other Ambulatory Visit: Payer: Self-pay | Admitting: Internal Medicine

## 2019-07-21 ENCOUNTER — Ambulatory Visit: Payer: Medicaid Other | Admitting: Family Medicine

## 2019-07-21 VITALS — BP 176/108 | HR 62 | Temp 98.5°F | Ht 71.0 in | Wt 216.0 lb

## 2019-07-21 DIAGNOSIS — M7989 Other specified soft tissue disorders: Secondary | ICD-10-CM

## 2019-07-21 DIAGNOSIS — F17200 Nicotine dependence, unspecified, uncomplicated: Secondary | ICD-10-CM

## 2019-07-21 DIAGNOSIS — L97829 Non-pressure chronic ulcer of other part of left lower leg with unspecified severity: Secondary | ICD-10-CM

## 2019-07-21 DIAGNOSIS — L98492 Non-pressure chronic ulcer of skin of other sites with fat layer exposed: Secondary | ICD-10-CM | POA: Insufficient documentation

## 2019-07-21 DIAGNOSIS — I10 Essential (primary) hypertension: Secondary | ICD-10-CM

## 2019-07-21 DIAGNOSIS — Z86718 Personal history of other venous thrombosis and embolism: Secondary | ICD-10-CM | POA: Insufficient documentation

## 2019-07-21 DIAGNOSIS — E876 Hypokalemia: Secondary | ICD-10-CM

## 2019-07-21 DIAGNOSIS — F17210 Nicotine dependence, cigarettes, uncomplicated: Secondary | ICD-10-CM | POA: Insufficient documentation

## 2019-07-21 MED ORDER — CLONIDINE HCL 0.1 MG PO TABS
0.1000 mg | ORAL_TABLET | Freq: Once | ORAL | Status: AC
  Administered 2019-07-21: 0.1 mg via ORAL

## 2019-07-21 MED ORDER — CLONIDINE HCL 0.1 MG PO TABS
0.2000 mg | ORAL_TABLET | Freq: Once | ORAL | Status: AC
  Administered 2019-07-21: 0.2 mg via ORAL

## 2019-07-21 MED ORDER — AMLODIPINE BESYLATE 10 MG PO TABS: 10.0000 mg | ORAL_TABLET | Freq: Every day | ORAL | 3 refills | Status: DC

## 2019-07-21 MED ORDER — LISINOPRIL 10 MG PO TABS: 10.0000 mg | ORAL_TABLET | Freq: Every day | ORAL | 3 refills | Status: DC

## 2019-07-21 MED FILL — AMLODIPINE BESYLATE 10 MG T: 10 | 30 days supply | Qty: 30 | Fill #0

## 2019-07-21 MED FILL — LISINOPRIL 10 MG TABS: 10 | 30 days supply | Qty: 30 | Fill #0

## 2019-07-21 NOTE — Assessment & Plan Note (Signed)
Hypertension poorly controlled  We gave the patient a dose of clonidine 0.3 mg and this brought the blood pressure down to 176/110  Plan will be to add lisinopril 10 mg daily and refill the amlodipine 10 mg daily will discontinue further diltiazem  Plan will also be to obtain thyroid profile, complete blood count, magnesium, and metabolic profile

## 2019-07-21 NOTE — Assessment & Plan Note (Signed)
Ongoing tobacco use in this patient  Smoking cessation counseling was given with the patient

## 2019-07-21 NOTE — Progress Notes (Signed)
Pt. Stated her blood pressure has been high at the wound clinic.   She is only on Amlodipine. She stated her PCP took her off on Cardizem and Potassium.   Pt. Stated she's been out of Amlodipine since March.

## 2019-07-21 NOTE — Progress Notes (Addendum)
Subjective:    Patient ID: Cheryl Mckinney, female    DOB: [birth year], 53 y.o.   MRN: [id number]  This is a 53 year old female seen today as a work and primary care patient of Dr. Jillyn HiddenFulp.  Patient has been out of her blood pressure medicines since March.  She has been followed in the wound care clinic for left lower extremity wound issue.  She comes into the office today as a work in patient with a blood pressure 176/121.  The patient denies any headache chest pain shortness of breath or cough. There is no edema in the lower extremities  There is a history of a wound in the left lower extremity that is now being under care of the wound care clinic that is improved  The patient had hypokalemia during the wounds care but repeat testing showed improvement  The patient previously was on amlodipine and also was on Cardizem along with potassium supplementation with previous history of superior ventricular tachycardia  The patient continues to smoke 1 pack a day of cigarettes  Past Medical History:  Diagnosis Date  . DVT (deep venous thrombosis) (HCC) 2016  . Hypertension      Family History  Problem Relation Age of Onset  . Hypertension Mother   . Diabetes Mother      Social History   Socioeconomic History  . Marital status: Single    Spouse name: Not on file  . Number of children: Not on file  . Years of education: Not on file  . Highest education level: Not on file  Occupational History  . Not on file  Social Needs  . Financial resource strain: Not on file  . Food insecurity    Worry: Not on file    Inability: Not on file  . Transportation needs    Medical: Not on file    Non-medical: Not on file  Tobacco Use  . Smoking status: Current Every Day Smoker    Packs/day: 0.50    Types: Cigarettes  . Smokeless tobacco: Never Used  Substance and Sexual Activity  . Alcohol use: Not Currently    Comment: once-twice weeks.   . Drug use: Yes    Types: Marijuana  . Sexual  activity: Not Currently  Lifestyle  . Physical activity    Days per week: Not on file    Minutes per session: Not on file  . Stress: Not on file  Relationships  . Social Musicianconnections    Talks on phone: Not on file    Gets together: Not on file    Attends religious service: Not on file    Active member of club or organization: Not on file    Attends meetings of clubs or organizations: Not on file    Relationship status: Not on file  . Intimate partner violence    Fear of current or ex partner: Not on file    Emotionally abused: Not on file    Physically abused: Not on file    Forced sexual activity: Not on file  Other Topics Concern  . Not on file  Social History Narrative  . Not on file     No Known Allergies   Outpatient Medications Prior to Visit  Medication Sig Dispense Refill  . amLODipine (NORVASC) 10 MG tablet Take 1 tablet (10 mg total) by mouth daily. 90 tablet 3  . doxycycline (VIBRAMYCIN) 100 MG capsule Take 1 capsule (100 mg total) by mouth 2 (two) times daily. 20 capsule 0  .  ibuprofen (ADVIL,MOTRIN) 200 MG tablet Take 600-800 mg by mouth every 4 (four) hours as needed (pain).    . naproxen sodium (ALEVE) 220 MG tablet Take 440 mg by mouth 2 (two) times daily as needed (pain).    Marland Kitchen diltiazem (CARDIZEM) 30 MG tablet Take 2 tablets (60 mg total) by mouth 3 (three) times daily. (Patient not taking: Reported on 07/21/2019) 60 tablet 0  . hydrOXYzine (ATARAX/VISTARIL) 25 MG tablet Take 1 tablet (25 mg total) by mouth every 6 (six) hours. (Patient not taking: Reported on 07/21/2019) 12 tablet 0  . potassium chloride SA (K-DUR,KLOR-CON) 20 MEQ tablet Take 1 tablet (20 mEq total) by mouth daily. (Patient not taking: Reported on 07/21/2019) 10 tablet 0  . traMADol (ULTRAM) 50 MG tablet Take 1 tablet (50 mg total) by mouth every 6 (six) hours as needed. (Patient not taking: Reported on 07/21/2019) 15 tablet 0  . Zinc Oxide (TRIPLE PASTE) 12.8 % ointment Apply 1 application topically as  needed for irritation. (Patient not taking: Reported on 12/03/2018) 56.7 g 1  . ipratropium-albuterol (DUONEB) 0.5-2.5 (3) MG/3ML nebulizer solution 3 mL      No facility-administered medications prior to visit.       Review of Systems Constitutional:   No  weight loss, night sweats,  Fevers, chills, fatigue, lassitude. HEENT:   No headaches,  Difficulty swallowing,  Tooth/dental problems,  Sore throat,                No sneezing, itching, ear ache, nasal congestion, post nasal drip,   CV:  No chest pain,  Orthopnea, PND, swelling in lower extremities, anasarca, dizziness, palpitations  GI  No heartburn, indigestion, abdominal pain, nausea, vomiting, diarrhea, change in bowel habits, loss of appetite  Resp: No shortness of breath with exertion or at rest.  No excess mucus, no productive cough,  No non-productive cough,  No coughing up of blood.  No change in color of mucus.  No wheezing.  No chest wall deformity  Skin: no rash or lesions.  Wound on left leg  GU: no dysuria, change in color of urine, no urgency or frequency.  No flank pain.  MS:  No joint pain or swelling.  No decreased range of motion.  No back pain.  Psych:  No change in mood or affect. No depression or anxiety.  No memory loss.     Objective:   Physical Exam Vitals:   07/21/19 0913 07/21/19 1005 07/21/19 1037  BP: (!) 179/121 (!) 180/110 (!) 176/108  Pulse: 62    Temp: 98.5 F (36.9 C)    TempSrc: Oral    SpO2: 95%    Weight: 216 lb (98 kg)    Height: 5\' 11"  (1.803 m)      Gen: Pleasant, well-nourished, in no distress,  normal affect  ENT: No lesions,  mouth clear,  oropharynx clear, no postnasal drip  Neck: No JVD, no TMG, no carotid bruits  Lungs: No use of accessory muscles, no dullness to percussion, clear without rales or rhonchi  Cardiovascular: RRR, heart sounds normal, no murmur or gallops, no peripheral edema, dressing on LLE  Abdomen: soft and NT, no HSM,  BS normal  Musculoskeletal:  No deformities, no cyanosis or clubbing  Neuro: alert, non focal  Skin: Warm, no lesions or rashes      Assessment & Plan:  I personally reviewed all images and lab data in the [facility] system as well as any outside material available during this office visit  and agree with the  radiology impressions.   Essential hypertension Hypertension poorly controlled  We gave the patient a dose of clonidine 0.3 mg and this brought the blood pressure down to 176/110  Plan will be to add lisinopril 10 mg daily and refill the amlodipine 10 mg daily will discontinue further diltiazem  Plan will also be to obtain thyroid profile, complete blood count, magnesium, and metabolic profile  Tobacco use disorder Ongoing tobacco use in this patient  Smoking cessation counseling was given with the patient   Shelle was seen today for medication management.  Diagnoses and all orders for this visit:  Essential hypertension -     cloNIDine (CATAPRES) tablet 0.2 mg -     amLODipine (NORVASC) 10 MG tablet; Take 1 tablet (10 mg total) by mouth daily. -     Comprehensive metabolic panel -     Thyroid Profile -     CBC with Differential/Platelet; Future -     Magnesium; Future -     Magnesium -     CBC with Differential/Platelet -     cloNIDine (CATAPRES) tablet 0.1 mg  Hypokalemia -     Comprehensive metabolic panel -     Magnesium; Future -     Magnesium  Tobacco use disorder  Other orders -     lisinopril (ZESTRIL) 10 MG tablet; Take 1 tablet (10 mg total) by mouth daily.

## 2019-07-21 NOTE — Patient Instructions (Addendum)
Amlodipine was refilled take that 1 daily  Begin lisinopril one 10 mg tablet daily for blood pressure  Discontinue diltiazem and potassium for now  You declined on the flu vaccine at this visit  Please begin to work on smoking cessation  Please obtain a blood pressure monitoring device to use at home to check your blood pressure daily and keep a log  Follow a low-salt diet as outlined below  Labs today include a metabolic profile blood count thyroid function and magnesium level we will call you these results  A follow-up appointment with Dr. Jillyn Hidden will be made in 1 month   Hypertension, Adult High blood pressure (hypertension) is when the force of blood pumping through the arteries is too strong. The arteries are the blood vessels that carry blood from the heart throughout the body. Hypertension forces the heart to work harder to pump blood and may cause arteries to become narrow or stiff. Untreated or uncontrolled hypertension can cause a heart attack, heart failure, a stroke, kidney disease, and other problems. A blood pressure reading consists of a higher number over a lower number. Ideally, your blood pressure should be below 120/80. The first ("top") number is called the systolic pressure. It is a measure of the pressure in your arteries as your heart beats. The second ("bottom") number is called the diastolic pressure. It is a measure of the pressure in your arteries as the heart relaxes. What are the causes? The exact cause of this condition is not known. There are some conditions that result in or are related to high blood pressure. What increases the risk? Some risk factors for high blood pressure are under your control. The following factors may make you more likely to develop this condition:  Smoking.  Having type 2 diabetes mellitus, high cholesterol, or both.  Not getting enough exercise or physical activity.  Being overweight.  Having too much fat, sugar, calories, or  salt (sodium) in your diet.  Drinking too much alcohol. Some risk factors for high blood pressure may be difficult or impossible to change. Some of these factors include:  Having chronic kidney disease.  Having a family history of high blood pressure.  Age. Risk increases with age.  Race. You may be at higher risk if you are African American.  Gender. Men are at higher risk than women before age 40. After age 23, women are at higher risk than men.  Having obstructive sleep apnea.  Stress. What are the signs or symptoms? High blood pressure may not cause symptoms. Very high blood pressure (hypertensive crisis) may cause:  Headache.  Anxiety.  Shortness of breath.  Nosebleed.  Nausea and vomiting.  Vision changes.  Severe chest pain.  Seizures. How is this diagnosed? This condition is diagnosed by measuring your blood pressure while you are seated, with your arm resting on a flat surface, your legs uncrossed, and your feet flat on the floor. The cuff of the blood pressure monitor will be placed directly against the skin of your upper arm at the level of your heart. It should be measured at least twice using the same arm. Certain conditions can cause a difference in blood pressure between your right and left arms. Certain factors can cause blood pressure readings to be lower or higher than normal for a short period of time:  When your blood pressure is higher when you are in a health care provider's office than when you are at home, this is called white coat hypertension.  Most people with this condition do not need medicines.  When your blood pressure is higher at home than when you are in a health care provider's office, this is called masked hypertension. Most people with this condition may need medicines to control blood pressure. If you have a high blood pressure reading during one visit or you have normal blood pressure with other risk factors, you may be asked  to:  Return on a different day to have your blood pressure checked again.  Monitor your blood pressure at home for 1 week or longer. If you are diagnosed with hypertension, you may have other blood or imaging tests to help your health care provider understand your overall risk for other conditions. How is this treated? This condition is treated by making healthy lifestyle changes, such as eating healthy foods, exercising more, and reducing your alcohol intake. Your health care provider may prescribe medicine if lifestyle changes are not enough to get your blood pressure under control, and if:  Your systolic blood pressure is above 130.  Your diastolic blood pressure is above 80. Your personal target blood pressure may vary depending on your medical conditions, your age, and other factors. Follow these instructions at home: Eating and drinking   Eat a diet that is high in fiber and potassium, and low in sodium, added sugar, and fat. An example eating plan is called the DASH (Dietary Approaches to Stop Hypertension) diet. To eat this way: ? Eat plenty of fresh fruits and vegetables. Try to fill one half of your plate at each meal with fruits and vegetables. ? Eat whole grains, such as whole-wheat pasta, brown rice, or whole-grain bread. Fill about one fourth of your plate with whole grains. ? Eat or drink low-fat dairy products, such as skim milk or low-fat yogurt. ? Avoid fatty cuts of meat, processed or cured meats, and poultry with skin. Fill about one fourth of your plate with lean proteins, such as fish, chicken without skin, beans, eggs, or tofu. ? Avoid pre-made and processed foods. These tend to be higher in sodium, added sugar, and fat.  Reduce your daily sodium intake. Most people with hypertension should eat less than 1,500 mg of sodium a day.  Do not drink alcohol if: ? Your health care provider tells you not to drink. ? You are pregnant, may be pregnant, or are planning to  become pregnant.  If you drink alcohol: ? Limit how much you use to:  0-1 drink a day for women.  0-2 drinks a day for men. ? Be aware of how much alcohol is in your drink. In the [place] one drink equals one 12 oz bottle of beer (355 mL), one 5 oz glass of wine (148 mL), or one 1 oz glass of hard liquor (44 mL). Lifestyle   Work with your health care provider to maintain a healthy body weight or to lose weight. Ask what an ideal weight is for you.  Get at least 30 minutes of exercise most days of the week. Activities may include walking, swimming, or biking.  Include exercise to strengthen your muscles (resistance exercise), such as Pilates or lifting weights, as part of your weekly exercise routine. Try to do these types of exercises for 30 minutes at least 3 days a week.  Do not use any products that contain nicotine or tobacco, such as cigarettes, e-cigarettes, and chewing tobacco. If you need help quitting, ask your health care provider.  Monitor your blood pressure at home  as told by your health care provider.  Keep all follow-up visits as told by your health care provider. This is important. Medicines  Take over-the-counter and prescription medicines only as told by your health care provider. Follow directions carefully. Blood pressure medicines must be taken as prescribed.  Do not skip doses of blood pressure medicine. Doing this puts you at risk for problems and can make the medicine less effective.  Ask your health care provider about side effects or reactions to medicines that you should watch for. Contact a health care provider if you:  Think you are having a reaction to a medicine you are taking.  Have headaches that keep coming back (recurring).  Feel dizzy.  Have swelling in your ankles.  Have trouble with your vision. Get help right away if you:  Develop a severe headache or confusion.  Have unusual weakness or numbness.  Feel faint.  Have severe pain  in your chest or abdomen.  Vomit repeatedly.  Have trouble breathing. Summary  Hypertension is when the force of blood pumping through your arteries is too strong. If this condition is not controlled, it may put you at risk for serious complications.  Your personal target blood pressure may vary depending on your medical conditions, your age, and other factors. For most people, a normal blood pressure is less than 120/80.  Hypertension is treated with lifestyle changes, medicines, or a combination of both. Lifestyle changes include losing weight, eating a healthy, low-sodium diet, exercising more, and limiting alcohol. This information is not intended to replace advice given to you by your health care provider. Make sure you discuss any questions you have with your health care provider. Document Released: 11/04/2005 Document Revised: 07/15/2018 Document Reviewed: 07/15/2018 Elsevier Patient Education  2020 [employer].

## 2019-07-22 LAB — THYROID PANEL
Free Thyroxine Index: 1.5 (ref 1.2–4.9)
T3 Uptake Ratio: 30 % (ref 24–39)
T4, Total: 5.1 ug/dL (ref 4.5–12.0)

## 2019-07-22 LAB — CBC WITH DIFFERENTIAL/PLATELET
Basophils Absolute: 0 10*3/uL (ref 0.0–0.2)
Basos: 1 %
EOS (ABSOLUTE): 0.3 10*3/uL (ref 0.0–0.4)
Eos: 5 %
Hematocrit: 41.4 % (ref 34.0–46.6)
Hemoglobin: 13.4 g/dL (ref 11.1–15.9)
Immature Grans (Abs): 0 10*3/uL (ref 0.0–0.1)
Immature Granulocytes: 0 %
Lymphocytes Absolute: 2.1 10*3/uL (ref 0.7–3.1)
Lymphs: 41 %
MCH: 27 pg (ref 26.6–33.0)
MCHC: 32.4 g/dL (ref 31.5–35.7)
MCV: 83 fL (ref 79–97)
Monocytes Absolute: 0.4 10*3/uL (ref 0.1–0.9)
Monocytes: 8 %
Neutrophils Absolute: 2.3 10*3/uL (ref 1.4–7.0)
Neutrophils: 45 %
Platelets: 175 10*3/uL (ref 150–450)
RBC: 4.97 x10E6/uL (ref 3.77–5.28)
RDW: 14.4 % (ref 11.7–15.4)
WBC: 5 10*3/uL (ref 3.4–10.8)

## 2019-07-22 LAB — COMPREHENSIVE METABOLIC PANEL
ALT: 14 IU/L (ref 0–32)
AST: 20 IU/L (ref 0–40)
Albumin/Globulin Ratio: 1.3 (ref 1.2–2.2)
Albumin: 4.2 g/dL (ref 3.8–4.9)
Alkaline Phosphatase: 70 IU/L (ref 39–117)
BUN/Creatinine Ratio: 15 (ref 9–23)
BUN: 12 mg/dL (ref 6–24)
Bilirubin Total: 0.5 mg/dL (ref 0.0–1.2)
CO2: 24 mmol/L (ref 20–29)
Calcium: 9.2 mg/dL (ref 8.7–10.2)
Chloride: 102 mmol/L (ref 96–106)
Creatinine, Ser: 0.8 mg/dL (ref 0.57–1.00)
GFR calc Af Amer: 99 mL/min/{1.73_m2} (ref >59)
GFR calc non Af Amer: 86 mL/min/{1.73_m2} (ref >59)
Globulin, Total: 3.3 g/dL (ref 1.5–4.5)
Glucose: 92 mg/dL (ref 65–99)
Potassium: 3.5 mmol/L (ref 3.5–5.2)
Sodium: 140 mmol/L (ref 134–144)
Total Protein: 7.5 g/dL (ref 6.0–8.5)

## 2019-07-22 LAB — MAGNESIUM: Magnesium: 2.2 mg/dL (ref 1.6–2.3)

## 2019-07-23 ENCOUNTER — Other Ambulatory Visit: Payer: Self-pay

## 2019-07-28 ENCOUNTER — Other Ambulatory Visit: Payer: Self-pay | Admitting: Internal Medicine

## 2019-07-28 ENCOUNTER — Other Ambulatory Visit: Payer: Medicaid Other

## 2019-07-28 DIAGNOSIS — L97829 Non-pressure chronic ulcer of other part of left lower leg with unspecified severity: Secondary | ICD-10-CM

## 2019-07-28 DIAGNOSIS — M7989 Other specified soft tissue disorders: Secondary | ICD-10-CM

## 2019-08-03 ENCOUNTER — Telehealth: Payer: Self-pay

## 2019-08-03 NOTE — Telephone Encounter (Signed)
Pt contacted the office to get lab results. Provided pt results    Pt states she had allergic reaction the lisinopril. Pt states her face and top lip was swollen. Pt states she has stop taking the medication.

## 2019-08-03 NOTE — Telephone Encounter (Signed)
Let pt know I will document the allergy and yes, stay off lisinopril and stay on amlodipine Will need another OV to f/u the BP either with me or her PCP Dr Chapman Fitch

## 2019-08-03 NOTE — Telephone Encounter (Signed)
Please help schedule patient for follow-up of her blood pressure

## 2019-08-05 NOTE — Telephone Encounter (Signed)
Contacted pt and left a detailed vm informing pt to continue the amlodipine and to keep her appointment with Dr. Chapman Fitch on 10/2. I also made pt aware that Dr. Joya Gaskins has marked lisinopril as allergy in her chart and if she has any questions or concerns to give me a call

## 2019-08-11 ENCOUNTER — Other Ambulatory Visit: Payer: Medicaid Other

## 2019-08-11 ENCOUNTER — Other Ambulatory Visit (HOSPITAL_COMMUNITY)
Admission: RE | Admit: 2019-08-11 | Discharge: 2019-08-11 | Disposition: A | Discharge status: Home / Self Care | Payer: Medicaid Other | Source: Other Acute Inpatient Hospital | Attending: Physician Assistant | Admitting: Physician Assistant

## 2019-08-11 DIAGNOSIS — L97829 Non-pressure chronic ulcer of other part of left lower leg with unspecified severity: Secondary | ICD-10-CM | POA: Insufficient documentation

## 2019-08-13 LAB — AEROBIC CULTURE W GRAM STAIN (SUPERFICIAL SPECIMEN): Gram Stain: NONE SEEN

## 2019-08-18 MED FILL — SULFAMETHOXAZOLE-TMP DS TAB: 800-160 | 14 days supply | Qty: 28 | Fill #0

## 2019-08-19 NOTE — Progress Notes (Signed)
Cheryl Mckinney, Yahaira ([id number]) Visit Report for 08/18/2019 Arrival Information Details Patient Name: Date of Service: Cheryl Mckinney, Cheryl Mckinney 08/18/2019 12:45 PM Medical Record Number:[id number] Patient Account Number: [id number] Date of Birth/Sex: 04/08/1966 (53 y.o. Female) Treating RN: Shawn Stalleaton, Bobbi Primary Care Donita Newland: Cain SaupeFulp, Cammie Other Clinician: Referring Tatanisha Cuthbert: Treating Sonnie Pawloski/Extender:Stone III, Havery MorosHoyt Fulp, Cammie Weeks in Treatment: 27 Visit Information History Since Last Visit Added or deleted any medications: No Patient Arrived: Ambulatory Any new allergies or adverse reactions: No Arrival Time: 12:58 Had a fall or experienced change in No Accompanied By: self activities of daily living that may affect Transfer Assistance: None risk of falls: Patient Identification Verified: Yes Signs or symptoms of abuse/neglect since last No Secondary Verification Process Yes visito Completed: Hospitalized since last visit: No Patient Requires Transmission-Based No Implantable device outside of the clinic excluding No Precautions: cellular tissue based products placed in the center Patient Has Alerts: Yes since last visit: Patient Alerts: L ABI 1.18 Has Dressing in Place as Prescribed: Yes R ABI 1.18 Has Compression in Place as Prescribed: Yes Pain Present Now: No Electronic Signature(s) Signed: 08/18/2019 6:01:09 PM By: Shawn Stalleaton, Bobbi Entered By: Shawn Stalleaton, Bobbi on 08/18/2019 12:58:34 -------------------------------------------------------------------------------- Compression Therapy Details Patient Name: Date of Service: Cheryl Mckinney, Cheryl Mckinney 08/18/2019 12:45 PM Medical Record Number:[id number] Patient Account Number: [id number] Date of Birth/Sex: 04/08/1966 (53 y.o. Female) Treating RN: Zenaida DeedBoehlein, Linda Primary Care Efton Thomley: Cain SaupeFulp, Cammie Other Clinician: Referring Adeline Petitfrere: Treating Khamari Sheehan/Extender:Stone III, Havery MorosHoyt Fulp, Cammie Weeks in Treatment: 27 Compression Therapy Performed for  Wound Wound #1 Left,Lateral Lower Leg Assessment: Performed By: Clinician Shawn Stalleaton, Bobbi, RN Compression Type: Four Layer Post Procedure Diagnosis Same as Pre-procedure Electronic Signature(s) Signed: 08/18/2019 6:37:17 PM By: Zenaida DeedBoehlein, Linda RN, BSN Entered By: Zenaida DeedBoehlein, Linda on 08/18/2019 13:25:29 -------------------------------------------------------------------------------- Encounter Discharge Information Details Patient Name: Date of Service: Cheryl Mckinney, Cheryl Mckinney 08/18/2019 12:45 PM Medical Record Number:[id number] Patient Account Number: [id number] Date of Birth/Sex: 04/08/1966 (53 y.o. Female) Treating RN: Shawn Stalleaton, Bobbi Primary Care Ashyr Hedgepath: Cain SaupeFulp, Cammie Other Clinician: Referring Keeyon Privitera: Treating Karter Hellmer/Extender:Stone III, Havery MorosHoyt Fulp, Cammie Weeks in Treatment: 27 Encounter Discharge Information Items Discharge Condition: Stable Ambulatory Status: Ambulatory Discharge Destination: Home Transportation: Private Auto Accompanied By: self Schedule Follow-up Appointment: Yes Clinical Summary of Care: Electronic Signature(s) Signed: 08/18/2019 6:01:09 PM By: Shawn Stalleaton, Bobbi Entered By: Shawn Stalleaton, Bobbi on 08/18/2019 14:20:23 -------------------------------------------------------------------------------- Lower Extremity Assessment Details Patient Name: Date of Service: Cheryl Mckinney, Cheryl Mckinney 08/18/2019 12:45 PM Medical Record Number:[id number] Patient Account Number: [id number] Date of Birth/Sex: 04/08/1966 (53 y.o. Female) Treating RN: Shawn Stalleaton, Bobbi Primary Care Koralee Wedeking: Cain SaupeFulp, Cammie Other Clinician: Referring Avagail Whittlesey: Treating Holy Battenfield/Extender:Stone III, Havery MorosHoyt Fulp, Cammie Weeks in Treatment: 27 Edema Assessment Assessed: [Left: Yes] [Right: No] Edema: [Left: Ye] [Right: s] Calf Left: Right: Point of Measurement: 37 cm From Medial Instep 36.4 cm cm Ankle Left: Right: Point of Measurement: 10 cm From Medial Instep 23.5 cm cm Vascular Assessment Pulses: Dorsalis Pedis Palpable:  [Left:Yes] Electronic Signature(s) Signed: 08/18/2019 6:01:09 PM By: Shawn Stalleaton, Bobbi Entered By: Shawn Stalleaton, Bobbi on 08/18/2019 12:59:38 -------------------------------------------------------------------------------- Multi-Disciplinary Care Plan Details Patient Name: Date of Service: Cheryl Mckinney, Cheryl Mckinney 08/18/2019 12:45 PM Medical Record Number:[id number] Patient Account Number: [id number] Date of Birth/Sex: 04/08/1966 (53 y.o. Female) Treating RN: Zenaida DeedBoehlein, Linda Primary Care Zarria Towell: Cain SaupeFulp, Cammie Other Clinician: Referring Aviel Davalos: Treating Ziva Nunziata/Extender:Stone III, Havery MorosHoyt Fulp, Cammie Weeks in Treatment: 27 Active Inactive Venous Leg Ulcer Nursing Diagnoses: Knowledge deficit related to disease process and management Potential for venous Insuffiency (use before diagnosis confirmed) Goals: Patient will maintain optimal edema control Date Initiated: 02/10/2019 Target Resolution Date: 08/20/2019 Goal Status: Active Interventions:  Assess peripheral edema status every visit. Compression as ordered Treatment Activities: Therapeutic compression applied : 02/10/2019 Notes: Electronic Signature(s) Signed: 08/18/2019 6:37:17 PM By: Zenaida Deed RN, BSN Entered By: Zenaida Deed on 08/18/2019 13:24:28 -------------------------------------------------------------------------------- Pain Assessment Details Patient Name: Date of Service: Cheryl Mckinney, Cheryl Mckinney 08/18/2019 12:45 PM Medical Record Number:[id number] Patient Account Number: [id number] Date of Birth/Sex: 09-Jun-1966 (53 y.o. Female) Treating RN: Shawn Stall Primary Care Josi Roediger: Cain Saupe Other Clinician: Referring Sharlotte Baka: Treating Jarelle Ates/Extender:Stone III, Havery Moros, Cammie Weeks in Treatment: 27 Active Problems Location of Pain Severity and Description of Pain Patient Has Paino No Site Locations Pain Management and Medication Current Pain Management: Medication: No Cold Application: No Rest: No Massage: No Activity:  No T.E.N.S.: No Heat Application: No Leg drop or elevation: No Is the Current Pain Management Adequate: Adequate How does your wound impact your activities of daily livingo Sleep: No Bathing: No Appetite: No Relationship With Others: No Bladder Continence: No Emotions: No Bowel Continence: No Work: No Toileting: No Drive: No Dressing: No Hobbies: No Electronic Signature(s) Signed: 08/18/2019 6:01:09 PM By: Shawn Stall Entered By: Shawn Stall on 08/18/2019 12:59:18 -------------------------------------------------------------------------------- Patient/Caregiver Education Details Patient Name: Cheryl Beams 9/30/2020andnbsp12:45 Date of Service: PM Medical Record [id number] Number: Patient Account Number: [id number] Treating RN: Date of Birth/Gender: May 31, 1966 (53 y.o. Zenaida Deed Female) Other Clinician: Primary Care Treating Fulp, Cammie Lenda Kelp Physician: Physician/Extender: Referring Physician: Meredith Pel in Treatment: 3 Education Assessment Education Provided To: Patient Education Topics Provided Infection: Methods: Explain/Verbal Responses: Reinforcements needed, State content correctly Venous: Methods: Explain/Verbal Responses: Reinforcements needed, State content correctly Electronic Signature(s) Signed: 08/18/2019 6:37:17 PM By: Zenaida Deed RN, BSN Entered By: Zenaida Deed on 08/18/2019 13:24:57 -------------------------------------------------------------------------------- Wound Assessment Details Patient Name: Date of Service: Cheryl Mckinney, Cheryl Mckinney 08/18/2019 12:45 PM Medical Record Number:[id number] Patient Account Number: [id number] Date of Birth/Sex: 1966/05/07 (53 y.o. Female) Treating RN: Shawn Stall Primary Care Sherlyn Ebbert: Cain Saupe Other Clinician: Referring Kory Panjwani: Treating Frazier Balfour/Extender:Stone III, Havery Moros, Cammie Weeks in Treatment: 27 Wound Status Wound Number: 1 Primary Etiology: Venous Leg  Ulcer Wound Location: Left Lower Leg - Lateral Wound Status: Open Wounding Event: Blister Comorbid History: Deep Vein Thrombosis, Hypertension Date Acquired: 02/16/2018 Weeks Of Treatment: 27 Clustered Wound: Yes Photos Wound Measurements Length: (cm) 2.4 % Reduct Width: (cm) 1.9 % Reduct Depth: (cm) 0.2 Epitheli Area: (cm) 3.581 Tunneli Volume: (cm) 0.716 Undermi Wound Description Classification: Full Thickness Without Exposed Support Foul Od Structures Slough/ Wound Flat and Intact Margin: Exudate Medium Amount: Exudate Serosanguineous Type: Exudate red, brown Color: Wound Bed Granulation Amount: Large (67-100%) Granulation Quality: Red Fascia E Necrotic Amount: Small (1-33%) Fat Laye Necrotic Quality: Adherent Slough Tendon E Muscle E Joint Ex Bone Exp or After Cleansing: No Fibrino Yes Exposed Structure xposed: No r (Subcutaneous Tissue) Exposed: Yes xposed: No xposed: No posed: No osed: No ion in Area: 96.2% ion in Volume: 92.4% alization: Medium (34-66%) ng: No ning: No Treatment Notes Wound #1 (Left, Lateral Lower Leg) 1. Cleanse With Wound Cleanser Soap and water 2. Periwound Care Barrier cream Moisturizing lotion TCA Cream 3. Primary Dressing Applied Iodoflex 4. Secondary Dressing Dry Gauze 6. Support Layer Applied 4 layer compression wrap Electronic Signature(s) Signed: 08/19/2019 3:21:35 PM By: Benjaman Kindler EMT/HBOT Signed: 08/19/2019 6:31:45 PM By: Shawn Stall Previous Signature: 08/18/2019 6:01:09 PM Version By: Shawn Stall Entered By: Benjaman Kindler on 08/19/2019 10:18:01 -------------------------------------------------------------------------------- Vitals Details Patient Name: Date of Service: Cheryl Mckinney, Cheryl Mckinney 08/18/2019 12:45 PM Medical Record Number:[id number] Patient Account Number: [id number] Date of Birth/Sex: [birth year] (53  y.o. Female) Treating RN: Deon Pilling Primary Care Armas Mcbee: Antony Blackbird Other  Clinician: Referring Keary Hanak: Treating Harjas Biggins/Extender:Stone III, Jillyn Hidden, Cammie Weeks in Treatment: 27 Vital Signs Time Taken: 13:00 Temperature (F): 98.5 Height (in): 71 Pulse (bpm): 71 Weight (lbs): 225 Respiratory Rate (breaths/min): 16 Body Mass Index (BMI): 31.4 Blood Pressure (mmHg): 135/91 Reference Range: 80 - 120 mg / dl Electronic Signature(s) Signed: 08/18/2019 6:01:09 PM By: Deon Pilling Entered By: Deon Pilling on 08/18/2019 13:02:24

## 2019-08-20 ENCOUNTER — Ambulatory Visit: Payer: Medicaid Other | Admitting: Family Medicine

## 2019-08-25 ENCOUNTER — Other Ambulatory Visit: Payer: Self-pay

## 2019-08-25 ENCOUNTER — Encounter (HOSPITAL_BASED_OUTPATIENT_CLINIC_OR_DEPARTMENT_OTHER): Payer: Self-pay | Attending: Physician Assistant | Admitting: Physician Assistant

## 2019-08-25 DIAGNOSIS — I10 Essential (primary) hypertension: Secondary | ICD-10-CM | POA: Insufficient documentation

## 2019-08-25 DIAGNOSIS — Z86718 Personal history of other venous thrombosis and embolism: Secondary | ICD-10-CM | POA: Insufficient documentation

## 2019-08-25 DIAGNOSIS — L97822 Non-pressure chronic ulcer of other part of left lower leg with fat layer exposed: Secondary | ICD-10-CM | POA: Insufficient documentation

## 2019-08-25 DIAGNOSIS — I890 Lymphedema, not elsewhere classified: Secondary | ICD-10-CM | POA: Insufficient documentation

## 2019-08-25 DIAGNOSIS — F17210 Nicotine dependence, cigarettes, uncomplicated: Secondary | ICD-10-CM | POA: Insufficient documentation

## 2019-08-25 MED FILL — AMLODIPINE BESYLATE 10 MG T: 10 | 30 days supply | Qty: 30 | Fill #1

## 2019-08-25 NOTE — Progress Notes (Addendum)
AZALEA, CEDAR ([id number]) Visit Report for 08/25/2019 Chief Complaint Document Details Patient Name: Date of Service: Cheryl Mckinney, Cheryl Mckinney 08/25/2019 12:45 PM Medical Record Number:[id number] Patient Account Number: [id number] Date of Birth/Sex: Treating RN: [birth year] (53 y.o. Cheryl Mckinney Primary Care Provider: Cain Saupe Other Clinician: Karl Mckinney Referring Provider: Treating Provider/Extender:Stone III, Havery Moros, Cammie Weeks in Treatment: 28 Information Obtained from: Patient Chief Complaint Leg ulcer with increased drainage Electronic Signature(s) Signed: 08/25/2019 1:23:41 PM By: Lenda Kelp PA-C Entered By: Lenda Kelp on 08/25/2019 13:23:40 -------------------------------------------------------------------------------- HPI Details Patient Name: Date of Service: Cheryl Mckinney, Cheryl Mckinney 08/25/2019 12:45 PM Medical Record Number:[id number] Patient Account Number: [id number] Date of Birth/Sex: Treating RN: 23-Sep-1966 (53 y.o. Cheryl Mckinney Primary Care Provider: Cain Saupe Other Clinician: Karl Mckinney Referring Provider: Treating Provider/Extender:Stone III, Havery Moros, Cammie Weeks in Treatment: 28 History of Present Illness HPI Description: Dysuria Cheryl Mckinney is a 53 year old African-American female with history of uncontrolled hypertension, remote history of left leg DVT, history of SVT, chronic left leg wound which has been in its present state for about a year with worsening which brings her to the clinic today. Patient was seen in the ED on March 6 of this year due to changes in the wound with malodorous drainage, she was evaluated and treated with Augmentin which she felt a week later and is continuing her course currently. Patient has a diagnosis of pyoderma gangrenosum however this is not substantiated with any records about when or where this diagnosis was made. In reviewing her records it appears that she had a referral to dermatology but has not  seen a dermatologist before. She has not had any biopsies of the left leg chronic ulcer before. She was anticoagulated for DVT in 2016 and at some point that was discontinued. More recently she had a Doppler study of the left leg that showed no evidence of DVT. She has never been to the wound clinic although she has had a referral in the past but had insurance and affordability issues and did not present to any wound clinic. Patient presents today with increasing drainage, leading to her frequently changing dressings on that leg. Patient has slight pain but denies any significant degree of pain.She has a fairly large ulcer on the left anterior shin area that was present for over a year and has enlarged but also has a new area of ulceration below it.She reports increasing drainage as a change that has become a problem recently 02/10/19 on evaluation today patient appears to be doing rather well in regard to her wound all things considering. She is having a lot of drainage however and there is a malodorous smell to the wound. She did again drain quite significantly even with the wrap. Nonetheless she has completed the doxycycline I'm wondering if this is really the best choice for her I did actually perform the wound culture today. No fevers, chills, nausea, or vomiting noted at this time. 02/24/19 on evaluation today patient appears to be doing well in regard to her left lower extremity ulcer. She's been tolerating the dressing changes without complication. With that being said I am pleased with the overall appearance today. 03/03/19 upon evaluation today patient appears to be doing excellent in regard to her left lower extremity. Overall I feel like she has been making excellent progress the wound is not hurting like it used to and overall other than just having some pitching under the wrap she states that she is doing great. The itching is just around the  wound not the whole leg I don't think she's  having any kind of reaction to the wrap itself. Fortunately she seems to be doing very well at this point. 03/10/19 on evaluation today patient appears to be doing well in regard to the overall appearance of her wound. There is some Slough noted is gonna require sharp debridement today but fortunately nothing too significant. Overall I'm very pleased with how things are doing. She's not having any significant pain which is also good news. 03/24/19 on evaluation today patient actually appears to be doing excellent in regard to her lower extremity ulcer. She's been tolerating the dressing changes without complication. Fortunately there's no signs of active infection at this time. Overall I'm very pleased with the progress and how things are going currently. The patient likewise is extremely pleased she's not having any significant pain which is also good news. 03/31/19 upon evaluation today patient actually appears to be doing very well in regard to her lower Trinity ulcer. Several of the satellite lesions have completely closed and she actually is not having any significant pain which is good news. No fevers, chills, nausea, or vomiting noted at this time. 04/07/19 on evaluation today patient appears to be doing well in regard to her lower Trinity ulcer which is shown signs of good improvement. Fortunately there's no signs of active infection. Wound that appeared be somewhat dry but she tells me that you took the dressing off in order to shower this morning and has not really had a complete dressing on throughout the day. That is why this is dry. Other than that everything appears to be doing excellent. 04/28/19 on evaluation today patient appears to be doing very well in regard to her lower Trinity ulcer. She's been tolerating the dressing changes without complication. Fortunately there is no signs of active infection at this time which is great news. Overall I'm extremely pleased with how things  appear. 05/05/19 on evaluation today patient appears to be doing very well in regard to her lower than the also. She does have some Slough and biofilm noted on the surface of the wound but fortunately nothing too significant at this point. No fevers, chills, nausea, or vomiting noted at this time. 6/25; patient I do not usually see. She has lymphedema with just in the distal left calf and lower extremity. [Inverted bottle sign]. She is using Hydrofera Blue to her wound and this really looks quite healthy. 05/19/19 on evaluation today patient's wound bed actually appears to be doing excellent at this time. She has been tolerating the dressing changes including the wrap without complication and seems to be healing quite nicely. Overall very pleased in this regard. 06/16/2019 on evaluation today patient actually appears to be doing quite well with regard to her left lower extremity ulcer. This is shown signs of excellent improvement which is great news. There does not appear to be any evidence of infection and she is making wonderful progress as far as I am concerned. Since last time of seeing her this is a little less than half the size it was. 06/23/2019 on evaluation today patient appears to be doing great in regard to her ulcer. She has been tolerating the dressing changes without complication. Fortunately the wound is showing signs of improvement is measuring smaller today and overall looks better as well. 06/30/2019 upon evaluation today patient actually appears to be doing well in regard to her leg ulcer. This really is not measuring much smaller compared to last evaluation.  With that being said is not any larger either. I think we may want to just switch away from using the [facility] we can just use the collagen alone. 8/21; patient I am seeing for the first time he has a venous insufficiency ulcer with surrounding stasis dermatitis. Apparently his wound is larger this week edema control is  not as good. He is not complaining of pain. ABIs on the left were normal. 07/14/2019 upon evaluation today patient actually appears to be doing quite well with regard to her left lower extremity ulcer. She has been tolerating the dressing changes without complication. Fortunately she is showing signs of improvement this appears to be doing better than last week according to what I am hearing apparently last week her wound was not doing nearly as well. That was a switch from where things have been previous. 07/21/2019 on evaluation today patient appears to be doing quite well with regard to her wounds over the left lower extremity. She is showing some signs of additional epithelialization which is good news wound bed appears to be doing better which is also good news. Overall there is no signs of active infection at this time. 9/14; using Iodoflex on the left lower extremity wound. Has venous reflux studies booked for 9/23. Dimensions are improved 08/11/2019 on evaluation today patient actually appears to be doing about the same with regard to her wound of the left lateral lower extremity. She has been tolerating the dressing changes without complication. Fortunately there is no signs of active infection at this time. No fever chills noted she did have an appointment this morning for her venous study although that has to be rescheduled as she overslept and missed that appointment. 08/18/2019 on evaluation today patient appears to be doing excellent in regard to her wound. The measurements are actually quite a bit smaller this week which is good news. With that being said I am still concerned with the fact that she did test positive for Staphylococcus aureus on her culture that we obtained last week. I think we may want to see about going ahead and initiate an antibiotic for her today. Other than that I feel like she is actually doing quite well. 08/25/2019 on evaluation today patient appears to be doing  well with regard to her lower extremity ulcer. She has been tolerating the dressing changes without complication. Fortunately there is no signs of active infection at this time. She has not gotten the antibiotic picked up yet she was waiting for the payday. I thought she was going to be able to get it last week she tells me she cannot get until today on the way home. Electronic Signature(s) Signed: 08/25/2019 1:58:05 PM By: Worthy Keeler PA-C Entered By: Worthy Keeler on 08/25/2019 13:58:04 -------------------------------------------------------------------------------- Physical Exam Details Patient Name: Date of Service: Cheryl Mckinney, Cheryl Mckinney 08/25/2019 12:45 PM Medical Record [id number] Patient Account Number: [id number] Date of Birth/Sex: Treating RN: 1965-12-14 (53 y.o. Elam Dutch Primary Care Provider: Antony Blackbird Other Clinician: Sandre Kitty Referring Provider: Treating Provider/Extender:Stone III, Jillyn Hidden, Cammie Weeks in Treatment: 65 Constitutional Well-nourished and well-hydrated in no acute distress. Respiratory normal breathing without difficulty. clear to auscultation bilaterally. Cardiovascular regular rate and rhythm with normal S1, S2. Psychiatric this patient is able to make decisions and demonstrates good insight into disease process. Alert and Oriented x 3. pleasant and cooperative. Notes Upon inspection patient's wound bed actually showed signs of fairly good granulation at this time there does not appear to be  any significant slough buildup at this point as well which is good news. Overall I am extremely pleased with the progress she seems to be making. Electronic Signature(s) Signed: 08/25/2019 1:58:32 PM By: Lenda Kelp PA-C Entered By: Lenda Kelp on 08/25/2019 13:58:32 -------------------------------------------------------------------------------- Physician Orders Details Patient Name: Date of Service: Cheryl Mckinney, Cheryl Mckinney 08/25/2019  12:45 PM Medical Record Number:[id number] Patient Account Number: [id number] Date of Birth/Sex: Treating RN: Oct 23, 1966 (53 y.o. 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 - Wed. Dressing Change Frequency Wound #1 Left,Lateral Lower Leg Do not change entire dressing for one week. Skin Barriers/Peri-Wound Care Moisturizing lotion TCA Cream or Ointment - mixed with lotion to leg Wound Cleansing May shower with protection. Primary Wound Dressing Wound #1 Left,Lateral Lower Leg Iodoflex Secondary Dressing Wound #1 Left,Lateral Lower Leg Dry Gauze ABD pad Edema Control 4 layer compression: Left lower extremity Avoid standing for long periods of time Elevate legs to the level of the heart or above for 30 minutes daily and/or when sitting, a frequency of: Exercise regularly Additional Orders / Instructions Stop/Decrease Smoking Electronic Signature(s) Signed: 08/26/2019 12:09:12 PM By: Zenaida Deed RN, BSN Signed: 08/29/2019 10:37:03 PM By: Lenda Kelp PA-C Entered By: Zenaida Deed on 08/25/2019 13:54:37 -------------------------------------------------------------------------------- Problem List Details Patient Name: Date of Service: Cheryl Mckinney, Cheryl Mckinney 08/25/2019 12:45 PM Medical Record Number:[id number] Patient Account Number: [id number] Date of Birth/Sex: Treating RN: [birth year] (53 y.o. Cheryl Mckinney Primary Care Provider: Cain Saupe Other Clinician: Karl Mckinney Referring Provider: Treating Provider/Extender:Stone III, Havery Moros, Cammie Weeks in  Treatment: 28 Active Problems ICD-10 Evaluated Encounter Code Description Active Date Today Diagnosis I89.0 Lymphedema, not elsewhere classified 02/04/2019 No Yes L97.829 Non-pressure chronic ulcer of other part of left lower 02/04/2019 No Yes leg with unspecified severity Inactive Problems Resolved Problems Electronic Signature(s) Signed: 08/25/2019 1:23:19 PM By: Lenda Kelp PA-C Entered By: Lenda Kelp on 08/25/2019 13:23:18 -------------------------------------------------------------------------------- Progress Note Details Patient Name: Date of Service: Cheryl Mckinney, Cheryl Mckinney 08/25/2019 12:45 PM Medical Record Number:[id number] Patient Account Number: [id number] Date of Birth/Sex: Treating RN: Apr 17, 1966 (53 y.o. Cheryl Mckinney Primary Care Provider: Cain Saupe Other Clinician: Karl Mckinney Referring Provider: Treating Provider/Extender:Stone III, Havery Moros, Cammie Weeks in Treatment: 28 Subjective Chief Complaint Information obtained from Patient Leg ulcer with increased drainage History of Present Illness (HPI) Dysuria Cheryl Mckinney is a 53 year old African-American female with history of uncontrolled hypertension, remote history of left leg DVT, history of SVT, chronic left leg wound which has been in its present state for about a year with worsening which brings her to the clinic today. Patient was seen in the ED on March 6 of this year due to changes in the wound with malodorous drainage, she was evaluated and treated with Augmentin which she felt a week later and is continuing her course currently. Patient has a diagnosis of pyoderma gangrenosum however this is not substantiated with any records about when or where this diagnosis was made. In reviewing her records it appears that she had a referral to dermatology but has not seen a dermatologist before. She has not had any biopsies of the left leg chronic ulcer before. She was anticoagulated for DVT in 2016 and at some  point that was discontinued. More recently she had a Doppler study of the left leg that showed no evidence of DVT. She has never been to the wound clinic although she has had a referral in the past but had insurance and affordability issues and did not present to any wound clinic. Patient presents today with increasing drainage, leading to her frequently changing dressings on that leg. Patient has slight pain but denies any significant degree of pain.She has a fairly large ulcer on the left anterior shin area that was present for over a year and has enlarged but also has a new area of ulceration below it.She reports increasing drainage as a change that has become a problem recently 02/10/19 on evaluation today patient appears to be doing rather well in regard to her wound all things considering. She is having a lot of drainage however and there is a malodorous smell to the wound. She did again drain quite significantly even with the wrap. Nonetheless she has completed the doxycycline I'm wondering if this is really the best choice for her I did actually perform the wound culture today. No fevers, chills, nausea, or vomiting noted at this time. 02/24/19 on evaluation today patient appears to be doing well in regard to her left lower extremity ulcer. She's been tolerating the dressing changes without complication. With that being said I am pleased with the overall appearance today. 03/03/19 upon evaluation today patient appears to be doing excellent in regard to her left lower extremity. Overall I feel like she has been making excellent progress the wound is not hurting like it used to and overall other than just having some pitching under the wrap she states that she is doing great. The itching is just around the wound not the whole leg I don't think she's having any kind of reaction to the wrap itself. Fortunately she seems to be doing very well at this point. 03/10/19 on evaluation today patient  appears to be doing well in regard to the overall appearance of her wound. There is some Slough noted is gonna require sharp debridement today but fortunately nothing too significant. Overall I'm very pleased with how things are doing. She's not having any significant pain which is also good news. 03/24/19 on evaluation today patient actually appears to be doing excellent in regard to her lower extremity ulcer. She's been tolerating the dressing changes without complication. Fortunately there's no signs of active infection at this time. Overall I'm very pleased with the progress and how things are going currently. The patient likewise is extremely pleased she's not having any significant pain which is also good news. 03/31/19 upon evaluation today patient actually appears to be doing very well in regard to her lower Trinity ulcer. Several of the satellite lesions have completely closed and she actually is not having any significant pain which is good news. No fevers, chills, nausea, or vomiting noted at this time. 04/07/19 on evaluation today patient appears to be doing well in regard to her lower Trinity ulcer which is shown signs of good improvement. Fortunately there's no signs of active infection. Wound that appeared be somewhat dry but she tells me that you took the dressing off in order to shower this morning and has not really had a complete dressing on throughout the day. That is why this is dry. Other than that everything appears to be doing excellent. 04/28/19 on evaluation today patient appears to be doing very well in regard to her lower Trinity ulcer. She's  been tolerating the dressing changes without complication. Fortunately there is no signs of active infection at this time which is great news. Overall I'm extremely pleased with how things appear. 05/05/19 on evaluation today patient appears to be doing very well in regard to her lower than the also. She does have some Slough and biofilm  noted on the surface of the wound but fortunately nothing too significant at this point. No fevers, chills, nausea, or vomiting noted at this time. 6/25; patient I do not usually see. She has lymphedema with just in the distal left calf and lower extremity. [Inverted bottle sign]. She is using Hydrofera Blue to her wound and this really looks quite healthy. 05/19/19 on evaluation today patient's wound bed actually appears to be doing excellent at this time. She has been tolerating the dressing changes including the wrap without complication and seems to be healing quite nicely. Overall very pleased in this regard. 06/16/2019 on evaluation today patient actually appears to be doing quite well with regard to her left lower extremity ulcer. This is shown signs of excellent improvement which is great news. There does not appear to be any evidence of infection and she is making wonderful progress as far as I am concerned. Since last time of seeing her this is a little less than half the size it was. 06/23/2019 on evaluation today patient appears to be doing great in regard to her ulcer. She has been tolerating the dressing changes without complication. Fortunately the wound is showing signs of improvement is measuring smaller today and overall looks better as well. 06/30/2019 upon evaluation today patient actually appears to be doing well in regard to her leg ulcer. This really is not measuring much smaller compared to last evaluation. With that being said is not any larger either. I think we may want to just switch away from using the [facility] we can just use the collagen alone. 8/21; patient I am seeing for the first time he has a venous insufficiency ulcer with surrounding stasis dermatitis. Apparently his wound is larger this week edema control is not as good. He is not complaining of pain. ABIs on the left were normal. 07/14/2019 upon evaluation today patient actually appears to be doing quite  well with regard to her left lower extremity ulcer. She has been tolerating the dressing changes without complication. Fortunately she is showing signs of improvement this appears to be doing better than last week according to what I am hearing apparently last week her wound was not doing nearly as well. That was a switch from where things have been previous. 07/21/2019 on evaluation today patient appears to be doing quite well with regard to her wounds over the left lower extremity. She is showing some signs of additional epithelialization which is good news wound bed appears to be doing better which is also good news. Overall there is no signs of active infection at this time. 9/14; using Iodoflex on the left lower extremity wound. Has venous reflux studies booked for 9/23. Dimensions are improved 08/11/2019 on evaluation today patient actually appears to be doing about the same with regard to her wound of the left lateral lower extremity. She has been tolerating the dressing changes without complication. Fortunately there is no signs of active infection at this time. No fever chills noted she did have an appointment this morning for her venous study although that has to be rescheduled as she overslept and missed that appointment. 08/18/2019 on evaluation today patient appears  to be doing excellent in regard to her wound. The measurements are actually quite a bit smaller this week which is good news. With that being said I am still concerned with the fact that she did test positive for Staphylococcus aureus on her culture that we obtained last week. I think we may want to see about going ahead and initiate an antibiotic for her today. Other than that I feel like she is actually doing quite well. 08/25/2019 on evaluation today patient appears to be doing well with regard to her lower extremity ulcer. She has been tolerating the dressing changes without complication. Fortunately there is no signs of  active infection at this time. She has not gotten the antibiotic picked up yet she was waiting for the payday. I thought she was going to be able to get it last week she tells me she cannot get until today on the way home. Patient History Information obtained from Patient. Family History Cancer - Paternal Grandparents, Diabetes - Father,Siblings, No family history of Heart Disease, Hereditary Spherocytosis, Hypertension, Kidney Disease, Lung Disease, Seizures, Stroke, Thyroid Problems. Social History Current every day smoker - 1 pack per week, Marital Status - Single, Alcohol Use - Rarely, Drug Use - Current History - Marijuana, Caffeine Use - Moderate - Soda. Medical History Eyes Denies history of Cataracts, Glaucoma, Optic Neuritis Ear/Nose/Mouth/Throat Denies history of Chronic sinus problems/congestion, Middle ear problems Hematologic/Lymphatic Denies history of Anemia, Hemophilia, Human Immunodeficiency Virus, Lymphedema, Sickle Cell Disease Respiratory Denies history of Aspiration, Asthma, Chronic Obstructive Pulmonary Disease (COPD), Pneumothorax, Sleep Apnea, Tuberculosis Cardiovascular Patient has history of Deep Vein Thrombosis - 2016 - left leg, Hypertension Denies history of Angina, Arrhythmia, Congestive Heart Failure, Coronary Artery Disease, Hypotension, Myocardial Infarction, Peripheral Arterial Disease, Peripheral Venous Disease, Phlebitis, Vasculitis Gastrointestinal Denies history of Cirrhosis , Colitis, Crohnoos, Hepatitis A, Hepatitis B, Hepatitis C Endocrine Denies history of Type I Diabetes, Type II Diabetes Genitourinary Denies history of End Stage Renal Disease Immunological Denies history of Lupus Erythematosus, Raynaudoos, Scleroderma Integumentary (Skin) Denies history of History of Burn Musculoskeletal Denies history of Gout, Rheumatoid Arthritis, Osteoarthritis, Osteomyelitis Neurologic Denies history of Dementia, Neuropathy, Quadriplegia,  Paraplegia, Seizure Disorder Oncologic Denies history of Received Chemotherapy, Received Radiation Psychiatric Denies history of Anorexia/bulimia, Confinement Anxiety Review of Systems (ROS) Constitutional Symptoms (General Health) Denies complaints or symptoms of Fatigue, Fever, Chills, Marked Weight Change. Respiratory Denies complaints or symptoms of Chronic or frequent coughs, Shortness of Breath. Cardiovascular Denies complaints or symptoms of Chest pain. Psychiatric Denies complaints or symptoms of Claustrophobia, Suicidal. Objective Constitutional Well-nourished and well-hydrated in no acute distress. Vitals Time Taken: 1:25 PM, Height: 71 in, Weight: 225 lbs, BMI: 31.4, Temperature: 98.4 F, Pulse: 69 bpm, Respiratory Rate: 16 breaths/min, Blood Pressure: 143/85 mmHg. Respiratory normal breathing without difficulty. clear to auscultation bilaterally. Cardiovascular regular rate and rhythm with normal S1, S2. Psychiatric this patient is able to make decisions and demonstrates good insight into disease process. Alert and Oriented x 3. pleasant and cooperative. General Notes: Upon inspection patient's wound bed actually showed signs of fairly good granulation at this time there does not appear to be any significant slough buildup at this point as well which is good news. Overall I am extremely pleased with the progress she seems to be making. Integumentary (Hair, Skin) Wound #1 status is Open. Original cause of wound was Blister. The wound is located on the Left,Lateral Lower Leg. The wound measures 3.3cm length x 2.7cm width x 0.1cm depth; 6.998cm^2 area and 0.7cm^3  volume. There is Fat Layer (Subcutaneous Tissue) Exposed exposed. There is no tunneling or undermining noted. There is a medium amount of serosanguineous drainage noted. The wound margin is flat and intact. There is large (67-100%) red granulation within the wound bed. There is a small (1-33%) amount of necrotic  tissue within the wound bed including Adherent Slough. 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 There was a Four Layer Compression Therapy Procedure by Cheryl Stall, RN. Post procedure Diagnosis Wound #1: Same as Pre-Procedure Plan Follow-up Appointments: Return Appointment in 1 week. - Wed. Dressing Change Frequency: Wound #1 Left,Lateral Lower Leg: Do not change entire dressing for one week. Skin Barriers/Peri-Wound Care: Moisturizing lotion TCA Cream or Ointment - mixed with lotion to leg Wound Cleansing: May shower with protection. Primary Wound Dressing: Wound #1 Left,Lateral Lower Leg: Iodoflex Secondary Dressing: Wound #1 Left,Lateral Lower Leg: Dry Gauze ABD pad Edema Control: 4 layer compression: Left lower extremity Avoid standing for long periods of time Elevate legs to the level of the heart or above for 30 minutes daily and/or when sitting, a frequency of: Exercise regularly Additional Orders / Instructions: Stop/Decrease Smoking 1. I would recommend that we continue with the Iodoflex as that seems to be helping the patient currently in my opinion. She still has some tenderness which again prior to the infection she really did not have I am going to go ahead and have her pick up the prescription on the way home today and start taking that. That is for the antibiotic that I prescribed her a couple weeks ago. 2. We will continue with a 4 layer compression wrap that seems to have done a great job controlling her edema. 3. We will also continue with the triamcinolone ointment around the periwound and leg region where she does have some itching and irritation. We will see patient back for reevaluation in 1 week here in the clinic. If  anything worsens or changes patient will contact our office for additional recommendations. Electronic Signature(s) Signed: 08/25/2019 1:59:14 PM By: Lenda Kelp PA-C Entered By: Lenda Kelp on 08/25/2019 13:59:14 -------------------------------------------------------------------------------- HxROS Details Patient Name: Date of Service: Cheryl Mckinney, Cheryl Mckinney 08/25/2019 12:45 PM Medical Record Number:[id number] Patient Account Number: [id number] Date of Birth/Sex: Treating RN: [birth year] (53 y.o. Cheryl Mckinney Primary Care Provider: Cain Saupe Other Clinician: Karl Mckinney Referring Provider: Treating Provider/Extender:Stone III, Havery Moros, Cammie Weeks in Treatment: 28 Information Obtained From Patient Constitutional Symptoms (General Health) Complaints and Symptoms: Negative for: Fatigue; Fever; Chills; Marked Weight Change Respiratory Complaints and Symptoms: Negative for: Chronic or frequent coughs; Shortness of Breath Medical History: Negative for: Aspiration; Asthma; Chronic Obstructive Pulmonary Disease (COPD); Pneumothorax; Sleep Apnea; Tuberculosis Cardiovascular Complaints and Symptoms: Negative for: Chest pain Medical History: Positive for: Deep Vein Thrombosis - 2016 - left leg; Hypertension Negative for: Angina; Arrhythmia; Congestive Heart Failure; Coronary Artery Disease; Hypotension; Myocardial Infarction; Peripheral Arterial Disease; Peripheral Venous Disease; Phlebitis; Vasculitis Psychiatric Complaints and Symptoms: Negative for: Claustrophobia; Suicidal Medical History: Negative for: Anorexia/bulimia; Confinement Anxiety Eyes Medical History: Negative for: Cataracts; Glaucoma; Optic Neuritis Ear/Nose/Mouth/Throat Medical History: Negative for: Chronic sinus problems/congestion; Middle ear problems Hematologic/Lymphatic Medical History: Negative for: Anemia; Hemophilia; Human Immunodeficiency Virus; Lymphedema; Sickle Cell  Disease Gastrointestinal Medical History: Negative for: Cirrhosis ; Colitis; Crohns; Hepatitis A; Hepatitis B; Hepatitis C  Endocrine Medical History: Negative for: Type I Diabetes; Type II Diabetes Genitourinary Medical History: Negative for: End Stage Renal Disease Immunological Medical History: Negative for: Lupus Erythematosus; Raynauds; Scleroderma Integumentary (Skin) Medical History: Negative for: History of Burn Musculoskeletal Medical History: Negative for: Gout; Rheumatoid Arthritis; Osteoarthritis; Osteomyelitis Neurologic Medical History: Negative for: Dementia; Neuropathy; Quadriplegia; Paraplegia; Seizure Disorder Oncologic Medical History: Negative for: Received Chemotherapy; Received Radiation Immunizations Pneumococcal Vaccine: Received Pneumococcal Vaccination: No Implantable Devices None Family and Social History Cancer: Yes - Paternal Grandparents; Diabetes: Yes - Father,Siblings; Heart Disease: No; Hereditary Spherocytosis: No; Hypertension: No; Kidney Disease: No; Lung Disease: No; Seizures: No; Stroke: No; Thyroid Problems: No; Current every day smoker - 1 pack per week; Marital Status - Single; Alcohol Use: Rarely; Drug Use: Current History - Marijuana; Caffeine Use: Moderate - Soda Physician Affirmation I have reviewed and agree with the above information. Electronic Signature(s) Signed: 08/26/2019 12:09:12 PM By: Zenaida Deed RN, BSN Signed: 08/29/2019 10:37:03 PM By: Lenda Kelp PA-C Entered By: Lenda Kelp on 08/25/2019 13:58:16 -------------------------------------------------------------------------------- SuperBill Details Patient Name: Date of Service: JOSEPHYNE, TARTER 08/25/2019 Medical Record Number:[id number] Patient Account Number: [id number] Date of Birth/Sex: Treating RN: 09/02/1966 (53 y.o. Cheryl Mckinney Primary Care Provider: Cain Saupe Other Clinician: Karl Mckinney Referring Provider: Treating  Provider/Extender:Stone III, Havery Moros, Cammie Weeks in Treatment: 28 Diagnosis Coding ICD-10 Codes Code Description I89.0 Lymphedema, not elsewhere classified L97.829 Non-pressure chronic ulcer of other part of left lower leg with unspecified severity Facility Procedures CPT4 Code Description: [id number] (Facility Use Only) [phone] - APPLY MULTLAY COMPRS LWR LT LEG Modifier: Quantity: 1 Physician Procedures CPT4 Code Description: [id number] - WC PHYS LEVEL 4 - EST PT ICD-10 Diagnosis Description I89.0 Lymphedema, not elsewhere classified L97.829 Non-pressure chronic ulcer of other part of left lower leg w Modifier: ith unspecifie Quantity: 1 d severity Electronic Signature(s) Signed: 08/25/2019 1:59:28 PM By: Lenda Kelp PA-C Entered By: Lenda Kelp on 08/25/2019 13:59:27

## 2019-08-26 NOTE — Progress Notes (Signed)
Cheryl, Mckinney ([id number]) Visit Report for 08/25/2019 Arrival Information Details Patient Name: Date of Service: Cheryl Mckinney, Cheryl Mckinney 08/25/2019 12:45 PM Medical Record Number:[id number] Patient Account Number: [id number] Date of Birth/Sex: Treating RN: [birth year] (53 y.o. Debara Pickett, Millard.Loa Primary Care Merary Garguilo: Cain Saupe Other Clinician: Karl Ito Referring Lamount Bankson: Treating Ulah Olmo/Extender:Stone III, Havery Moros, Cammie Weeks in Treatment: 28 Visit Information History Since Last Visit Added or deleted any medications: No Patient Arrived: Ambulatory Any new allergies or adverse reactions: No Arrival Time: 13:22 Had a fall or experienced change in No Accompanied By: self activities of daily living that may affect Transfer Assistance: None risk of falls: Patient Identification Verified: Yes Signs or symptoms of abuse/neglect since last No Secondary Verification Process Yes visito Completed: Hospitalized since last visit: No Patient Requires Transmission-Based No Implantable device outside of the clinic excluding No Precautions: cellular tissue based products placed in the center Patient Has Alerts: Yes since last visit: Patient Alerts: L ABI 1.18 Has Dressing in Place as Prescribed: Yes R ABI 1.18 Has Compression in Place as Prescribed: No Pain Present Now: No Electronic Signature(s) Signed: 08/25/2019 6:07:42 PM By: Shawn Stall Entered By: Shawn Stall on 08/25/2019 13:22:40 -------------------------------------------------------------------------------- Compression Therapy Details Patient Name: Date of Service: Cheryl, Mckinney 08/25/2019 12:45 PM Medical Record Number:[id number] Patient Account Number: [id number] Date of Birth/Sex: Treating RN: 1966-02-16 (53 y.o. [name] Primary Care Arlon Bleier: Cain Saupe Other Clinician: Karl Ito Referring Taetum Flewellen: Treating Aadvika Konen/Extender:Stone III, Havery Moros, Cammie Weeks in Treatment:  28 Compression Therapy Performed for Wound Wound #1 Left,Lateral Lower Leg Assessment: Performed By: Clinician Shawn Stall, RN Compression Type: Four Layer Post Procedure Diagnosis Same as Pre-procedure Electronic Signature(s) Signed: 08/26/2019 12:09:12 PM By: Zenaida Deed RN, BSN Entered By: Zenaida Deed on 08/25/2019 13:54:06 -------------------------------------------------------------------------------- Encounter Discharge Information Details Patient Name: Date of Service: Cheryl, Mckinney 08/25/2019 12:45 PM Medical Record Number:[id number] Patient Account Number: [id number] Date of Birth/Sex: Treating RN: 05/02/1966 (53 y.o. Arta Silence Primary Care Montoya Watkin: Cain Saupe Other Clinician: Karl Ito Referring Kaedance Magos: Treating Jacquees Gongora/Extender:Stone III, Havery Moros, Cammie Weeks in Treatment: 28 Encounter Discharge Information Items Discharge Condition: Stable Ambulatory Status: Ambulatory Discharge Destination: Home Transportation: Private Auto Accompanied By: self Schedule Follow-up Appointment: Yes Clinical Summary of Care: Electronic Signature(s) Signed: 08/25/2019 6:07:42 PM By: Shawn Stall Entered By: Shawn Stall on 08/25/2019 14:10:48 -------------------------------------------------------------------------------- Lower Extremity Assessment Details Patient Name: Date of Service: Cheryl, Mckinney 08/25/2019 12:45 PM Medical Record Number:[id number] Patient Account Number: [id number] Date of Birth/Sex: Treating RN: Dec 08, 1965 (53 y.o. Arta Silence Primary Care Zeph Riebel: Cain Saupe Other Clinician: Karl Ito Referring Daleyza Gadomski: Treating Misha Antonini/Extender:Stone III, Havery Moros, Cammie Weeks in Treatment: 28 Edema Assessment Assessed: [Left: Yes] [Right: No] Edema: [Left: Ye] [Right: s] Calf Left: Right: Point of Measurement: 37 cm From Medial Instep 36.5 cm cm Ankle Left: Right: Point of Measurement: 10 cm From Medial Instep 26 cm  cm Vascular Assessment Pulses: Dorsalis Pedis Palpable: [Left:Yes] Electronic Signature(s) Signed: 08/25/2019 6:07:42 PM By: Shawn Stall Entered By: Shawn Stall on 08/25/2019 13:24:36 -------------------------------------------------------------------------------- Multi-Disciplinary Care Plan Details Patient Name: Date of Service: Cheryl, [name] 08/25/2019 12:45 PM Medical Record Number:[id number] Patient Account Number: [id number] Date of Birth/Sex: Treating RN: 01-23-1966 (53 y.o. [name] Primary Care Loneta Tamplin: Cain Saupe Other Clinician: Karl Ito Referring Tranice Laduke: Treating Trueman Worlds/Extender:Stone III, Havery Moros, Cammie Weeks in Treatment: 28 Active Inactive Venous Leg Ulcer Nursing Diagnoses: Knowledge deficit related to disease process and management Potential for venous Insuffiency (use before diagnosis confirmed) Goals: Patient will maintain optimal edema control Date  Initiated: 02/10/2019 Target Resolution Date: 09/22/2019 Goal Status: Active Interventions: Assess peripheral edema status every visit. Compression as ordered Treatment Activities: Therapeutic compression applied : 02/10/2019 Notes: Electronic Signature(s) Signed: 08/26/2019 12:09:12 PM By: Baruch Gouty RN, BSN Entered By: Baruch Gouty on 08/25/2019 13:53:12 -------------------------------------------------------------------------------- Pain Assessment Details Patient Name: Date of Service: Cheryl, Mckinney 08/25/2019 12:45 PM Medical Record [id number] Patient Account Number: [id number] Date of Birth/Sex: Treating RN: 18-Jan-1966 (53 y.o. Debby Bud Primary Care Kasiah Manka: Antony Blackbird Other Clinician: Sandre Kitty Referring Rilie Glanz: Treating Vernal Hritz/Extender:Stone III, Jillyn Hidden, Cammie Weeks in Treatment: 28 Active Problems Location of Pain Severity and Description of Pain Patient Has Paino No Site Locations Rate the pain. Current Pain Level:  0 Worst Pain Level: 10 Least Pain Level: 0 Tolerable Pain Level: 7 Pain Management and Medication Current Pain Management: Medication: No Cold Application: No Rest: No Massage: No Activity: No T.E.N.S.: No Heat Application: No Leg drop or elevation: No Is the Current Pain Management Adequate: Adequate How does your wound impact your activities of daily livingo Sleep: No Bathing: No Appetite: No Relationship With Others: No Bladder Continence: No Emotions: No Bowel Continence: No Work: No Toileting: No Drive: No Dressing: No Hobbies: No Electronic Signature(s) Signed: 08/25/2019 6:07:42 PM By: Deon Pilling Entered By: Deon Pilling on 08/25/2019 13:23:03 -------------------------------------------------------------------------------- Patient/Caregiver Education Details Patient Name: Date of Service: Iris Pert 10/7/2020andnbsp12:45 PM Medical Record Patient Account Number: [id number] Number: Treating RN: Baruch Gouty Date of Birth/Gender: 07/09/1966 (54 y.o. F) Other Clinician: Sandre Kitty Primary Care Physician: Chapman Fitch, Cammie Treating Worthy Keeler Referring Physician: Physician/Extender: Donalynn Furlong in Treatment: 28 Education Assessment Education Provided To: Patient Education Topics Provided Venous: Methods: Explain/Verbal Responses: Reinforcements needed, State content correctly Wound/Skin Impairment: Methods: Explain/Verbal Responses: Reinforcements needed, State content correctly Electronic Signature(s) Signed: 08/26/2019 12:09:12 PM By: Baruch Gouty RN, BSN Entered By: Baruch Gouty on 08/25/2019 13:53:34 -------------------------------------------------------------------------------- Wound Assessment Details Patient Name: Date of Service: ADAIJAH, ENDRES 08/25/2019 12:45 PM Medical Record [id number] Patient Account Number: [id number] Date of Birth/Sex: Treating RN: Mar 03, 1966 (53 y.o. Helene Shoe, Meta.Reding Primary  Care Alexus Galka: Antony Blackbird Other Clinician: Sandre Kitty Referring Gram Siedlecki: Treating Tayvon Culley/Extender:Stone III, Jillyn Hidden, Cammie Weeks in Treatment: 28 Wound Status Wound Number: 1 Primary Etiology: Venous Leg Ulcer Wound Location: Left Lower Leg - Lateral Wound Status: Open Wounding Event: Blister Comorbid History: Deep Vein Thrombosis, Hypertension Date Acquired: 02/16/2018 Weeks Of Treatment: 28 Clustered Wound: Yes Photos Wound Measurements Length: (cm) 3.3 % Reduct Width: (cm) 2.7 % Reduct Depth: (cm) 0.1 Epitheli Clustered Quantity: 2 Tunnelin Area: (cm) 6.998 Undermi Volume: (cm) 0.7 Wound Description Full Thickness Without Exposed Support Foul Od Classification: Structures Slough/ Wound Flat and Intact Margin: Exudate Medium Amount: Exudate Serosanguineous Type: Exudate red, brown Color: Wound Bed Granulation Amount: Large (67-100%) Granulation Quality: Red Fascia E Necrotic Amount: Small (1-33%) Fat Laye Necrotic Quality: Adherent Slough Tendon E Muscle E Joint Ex Bone Exp or After Cleansing: No Fibrino Yes Exposed Structure xposed: No r (Subcutaneous Tissue) Exposed: Yes xposed: No xposed: No posed: No osed: No ion in Area: 92.6% ion in Volume: 92.6% alization: Medium (34-66%) g: No ning: No Treatment Notes Wound #1 (Left, Lateral Lower Leg) 1. Cleanse With Wound Cleanser Soap and water 2. Periwound Care Barrier cream Moisturizing lotion TCA Cream 3. Primary Dressing Applied Iodoflex 4. Secondary Dressing Dry Gauze 6. Support Layer Applied 4 layer compression wrap Notes netting. Electronic Signature(s) Signed: 08/26/2019 1:38:24 PM By: Mikeal Hawthorne EMT/HBOT Signed: 08/26/2019 6:06:48 PM By: Deon Pilling  Previous Signature: 08/25/2019 6:07:42 PM Version By: Shawn Stalleaton, Bobbi Entered By: Benjaman KindlerJones, Dedrick on 08/26/2019 11:56:46 -------------------------------------------------------------------------------- Vitals  Details Patient Name: Date of Service: Beryle BeamsSWANN, Shavy 08/25/2019 12:45 PM Medical Record Number:[id number] Patient Account Number: [id number] Date of Birth/Sex: Treating RN: [birth year] (53 y.o. Debara PickettF) Deaton, Millard.LoaBobbi Primary Care Margot Oriordan: Cain SaupeFulp, Cammie Other Clinician: Karl Itoawkins, Destiny Referring Soriah Leeman: Treating Joevanni Roddey/Extender:Stone III, Havery MorosHoyt Fulp, Cammie Weeks in Treatment: 28 Vital Signs Time Taken: 13:25 Temperature (F): 98.4 Height (in): 71 Pulse (bpm): 69 Weight (lbs): 225 Respiratory Rate (breaths/min): 16 Body Mass Index (BMI): 31.4 Blood Pressure (mmHg): 143/85 Reference Range: 80 - 120 mg / dl Electronic Signature(s) Signed: 08/25/2019 6:07:42 PM By: Shawn Stalleaton, Bobbi Entered By: Shawn Stalleaton, Bobbi on 08/25/2019 13:26:40

## 2019-09-01 ENCOUNTER — Encounter (HOSPITAL_BASED_OUTPATIENT_CLINIC_OR_DEPARTMENT_OTHER): Payer: Self-pay | Admitting: Physician Assistant

## 2019-09-03 MED FILL — SULFAMETHOXAZOLE-TMP DS TAB: 800-160 | 14 days supply | Qty: 28 | Fill #0

## 2019-09-08 ENCOUNTER — Other Ambulatory Visit: Payer: Self-pay

## 2019-09-08 ENCOUNTER — Encounter (HOSPITAL_BASED_OUTPATIENT_CLINIC_OR_DEPARTMENT_OTHER): Payer: Self-pay | Admitting: Physician Assistant

## 2019-09-08 NOTE — Progress Notes (Signed)
Cheryl Mckinney, Cheryl Mckinney ([id number]) Visit Report for 09/08/2019 Chief Complaint Document Details Patient Name: Date of Service: Cheryl Mckinney, Cheryl Mckinney 09/08/2019 10:30 AM Medical Record Number:[id number] Patient Account Number: [id number] Date of Birth/Sex: Treating RN: 08-31-1966 (53 y.o. Cheryl Mckinney Primary Care Provider: Cain Saupe Other Clinician: Referring Provider: Treating Provider/Extender:Stone III, Havery Moros, Cammie Weeks in Treatment: 30 Information Obtained from: Patient Chief Complaint Leg ulcer with increased drainage Electronic Signature(s) Signed: 09/08/2019 6:13:43 PM By: Lenda Kelp PA-C Entered By: Lenda Kelp on 09/08/2019 11:51:22 -------------------------------------------------------------------------------- Debridement Details Patient Name: Date of Service: Cheryl Mckinney, Cheryl Mckinney 09/08/2019 10:30 AM Medical Record Number:[id number] Patient Account Number: [id number] Date of Birth/Sex: 06-Jul-1966 (53 y.o. F) Treating RN: Zandra Abts Primary Care Provider: Cain Saupe Other Clinician: Referring Provider: Treating Provider/Extender:Stone III, Havery Moros, Cammie Weeks in Treatment: 30 Debridement Performed for Wound #1 Left,Lateral Lower Leg Assessment: Performed By: Physician Lenda Kelp, PA Debridement Type: Debridement Severity of Tissue Pre Fat layer exposed Debridement: Level of Consciousness (Pre- Awake and Alert procedure): Pre-procedure Verification/Time Out Taken: Yes - 11:55 Start Time: 11:55 Pain Control: Lidocaine 4% Topical Solution Total Area Debrided (L x W): 3.2 (cm) x 2.1 (cm) = 6.72 (cm) Tissue and other material Viable, Non-Viable, Callus, Slough, Subcutaneous, Slough debrided: Level: Skin/Subcutaneous Tissue Debridement Description: Excisional Instrument: Curette Bleeding: Minimum Hemostasis Achieved: Pressure End Time: 11:57 Procedural Pain: 4 Post Procedural Pain: 2 Response to Treatment: Procedure was tolerated well Level  of Consciousness Awake and Alert (Post-procedure): Post Debridement Measurements of Total Wound Length: (cm) 3.2 Width: (cm) 2.1 Depth: (cm) 0.3 Volume: (cm) 1.583 Character of Wound/Ulcer Post Improved Debridement: Severity of Tissue Post Debridement: Fat layer exposed Post Procedure Diagnosis Same as Pre-procedure Electronic Signature(s) Signed: 09/08/2019 6:13:43 PM By: Lenda Kelp PA-C Signed: 09/08/2019 6:50:57 PM By: Zandra Abts RN, BSN Entered By: Zandra Abts on 09/08/2019 11:58:57 -------------------------------------------------------------------------------- HPI Details Patient Name: Date of Service: Cheryl Mckinney, Cheryl Mckinney 09/08/2019 10:30 AM Medical Record Number:[id number] Patient Account Number: [id number] Date of Birth/Sex: Treating RN: [birth year] (53 y.o. Cheryl Mckinney Primary Care Provider: Cain Saupe Other Clinician: Referring Provider: Treating Provider/Extender:Stone III, Havery Moros, Cammie Weeks in Treatment: 30 History of Present Illness HPI Description: Dysuria Cheryl Mckinney is a 53 year old African-American female with history of uncontrolled hypertension, remote history of left leg DVT, history of SVT, chronic left leg wound which has been in its present state for about a year with worsening which brings her to the clinic today. Patient was seen in the ED on March 6 of this year due to changes in the wound with malodorous drainage, she was evaluated and treated with Augmentin which she felt a week later and is continuing her course currently. Patient has a diagnosis of pyoderma gangrenosum however this is not substantiated with any records about when or where this diagnosis was made. In reviewing her records it appears that she had a referral to dermatology but has not seen a dermatologist before. She has not had any biopsies of the left leg chronic ulcer before. She was anticoagulated for DVT in 2016 and at some point that was discontinued. More recently  she had a Doppler study of the left leg that showed no evidence of DVT. She has never been to the wound clinic although she has had a referral in the past but had insurance and affordability issues and did not present to any wound clinic. Patient presents today with increasing drainage, leading to her frequently changing dressings on that leg. Patient has slight pain but denies any significant  degree of pain.She has a fairly large ulcer on the left anterior shin area that was present for over a year and has enlarged but also has a new area of ulceration below it.She reports increasing drainage as a change that has become a problem recently 02/10/19 on evaluation today patient appears to be doing rather well in regard to her wound all things considering. She is having a lot of drainage however and there is a malodorous smell to the wound. She did again drain quite significantly even with the wrap. Nonetheless she has completed the doxycycline I'm wondering if this is really the best choice for her I did actually perform the wound culture today. No fevers, chills, nausea, or vomiting noted at this time. 02/24/19 on evaluation today patient appears to be doing well in regard to her left lower extremity ulcer. She's been tolerating the dressing changes without complication. With that being said I am pleased with the overall appearance today. 03/03/19 upon evaluation today patient appears to be doing excellent in regard to her left lower extremity. Overall I feel like she has been making excellent progress the wound is not hurting like it used to and overall other than just having some pitching under the wrap she states that she is doing great. The itching is just around the wound not the whole leg I don't think she's having any kind of reaction to the wrap itself. Fortunately she seems to be doing very well at this point. 03/10/19 on evaluation today patient appears to be doing well in regard to the  overall appearance of her wound. There is some Slough noted is gonna require sharp debridement today but fortunately nothing too significant. Overall I'm very pleased with how things are doing. She's not having any significant pain which is also good news. 03/24/19 on evaluation today patient actually appears to be doing excellent in regard to her lower extremity ulcer. She's been tolerating the dressing changes without complication. Fortunately there's no signs of active infection at this time. Overall I'm very pleased with the progress and how things are going currently. The patient likewise is extremely pleased she's not having any significant pain which is also good news. 03/31/19 upon evaluation today patient actually appears to be doing very well in regard to her lower Trinity ulcer. Several of the satellite lesions have completely closed and she actually is not having any significant pain which is good news. No fevers, chills, nausea, or vomiting noted at this time. 04/07/19 on evaluation today patient appears to be doing well in regard to her lower Trinity ulcer which is shown signs of good improvement. Fortunately there's no signs of active infection. Wound that appeared be somewhat dry but she tells me that you took the dressing off in order to shower this morning and has not really had a complete dressing on throughout the day. That is why this is dry. Other than that everything appears to be doing excellent. 04/28/19 on evaluation today patient appears to be doing very well in regard to her lower Trinity ulcer. She's been tolerating the dressing changes without complication. Fortunately there is no signs of active infection at this time which is great news. Overall I'm extremely pleased with how things appear. 05/05/19 on evaluation today patient appears to be doing very well in regard to her lower than the also. She does have some Slough and biofilm noted on the surface of the wound but  fortunately nothing too significant at this point. No fevers, chills,  nausea, or vomiting noted at this time. 6/25; patient I do not usually see. She has lymphedema with just in the distal left calf and lower extremity. [Inverted bottle sign]. She is using Hydrofera Blue to her wound and this really looks quite healthy. 05/19/19 on evaluation today patient's wound bed actually appears to be doing excellent at this time. She has been tolerating the dressing changes including the wrap without complication and seems to be healing quite nicely. Overall very pleased in this regard. 06/16/2019 on evaluation today patient actually appears to be doing quite well with regard to her left lower extremity ulcer. This is shown signs of excellent improvement which is great news. There does not appear to be any evidence of infection and she is making wonderful progress as far as I am concerned. Since last time of seeing her this is a little less than half the size it was. 06/23/2019 on evaluation today patient appears to be doing great in regard to her ulcer. She has been tolerating the dressing changes without complication. Fortunately the wound is showing signs of improvement is measuring smaller today and overall looks better as well. 06/30/2019 upon evaluation today patient actually appears to be doing well in regard to her leg ulcer. This really is not measuring much smaller compared to last evaluation. With that being said is not any larger either. I think we may want to just [facility] we can just use the collagen alone. 8/21; patient I am seeing for the first time he has a venous insufficiency ulcer with surrounding stasis dermatitis. Apparently his wound is larger this week edema control is not as good. He is not complaining of pain. ABIs on the left were normal. 07/14/2019 upon evaluation today patient actually appears to be doing quite well with regard to her left  lower extremity ulcer. She has been tolerating the dressing changes without complication. Fortunately she is showing signs of improvement this appears to be doing better than last week according to what I am hearing apparently last week her wound was not doing nearly as well. That was a switch from where things have been previous. 07/21/2019 on evaluation today patient appears to be doing quite well with regard to her wounds over the left lower extremity. She is showing some signs of additional epithelialization which is good news wound bed appears to be doing better which is also good news. Overall there is no signs of active infection at this time. 9/14; using Iodoflex on the left lower extremity wound. Has venous reflux studies booked for 9/23. Dimensions are improved 08/11/2019 on evaluation today patient actually appears to be doing about the same with regard to her wound of the left lateral lower extremity. She has been tolerating the dressing changes without complication. Fortunately there is no signs of active infection at this time. No fever chills noted she did have an appointment this morning for her venous study although that has to be rescheduled as she overslept and missed that appointment. 08/18/2019 on evaluation today patient appears to be doing excellent in regard to her wound. The measurements are actually quite a bit smaller this week which is good news. With that being said I am still concerned with the fact that she did test positive for Staphylococcus aureus on her culture that we obtained last week. I think we may want to see about going ahead and initiate an antibiotic for her today. Other than that I feel like she is  actually doing quite well. 08/25/2019 on evaluation today patient appears to be doing well with regard to her lower extremity ulcer. She has been tolerating the dressing changes without complication. Fortunately there is no signs of active infection at this time.  She has not gotten the antibiotic picked up yet she was waiting for the payday. I thought she was going to be able to get it last week she tells me she cannot get until today on the way home. 09/08/2019 on evaluation today patient appears to be doing about the same with regard to her lower extremity ulcer unfortunately. There is no signs of active infection at this time she has been tolerating the dressing changes without any complication. With that being said I think it may be time for a change to see if we do something different to help her wound to heal more appropriately as she seems to be somewhat stalled in my opinion. Electronic Signature(s) Signed: 09/08/2019 6:13:43 PM By: Lenda Kelp PA-C Entered By: Lenda Kelp on 09/08/2019 12:00:35 -------------------------------------------------------------------------------- Physical Exam Details Patient Name: Date of Service: Cheryl Mckinney, Cheryl Mckinney 09/08/2019 10:30 AM Medical Record Number:[id number] Patient Account Number: [id number] Date of Birth/Sex: Treating RN: Jun 10, 1966 (53 y.o. Cheryl Mckinney Primary Care Provider: Cain Saupe Other Clinician: Referring Provider: Treating Provider/Extender:Stone III, Havery Moros, Cammie Weeks in Treatment: 30 Constitutional Well-nourished and well-hydrated in no acute distress. Respiratory normal breathing without difficulty. Psychiatric this patient is able to make decisions and demonstrates good insight into disease process. Alert and Oriented x 3. pleasant and cooperative. Notes Patient's wound bed currently did require some sharp debridement to remove some of the necrotic tissue from the surface of the wound she tolerated that today without complication and post debridement wound bed appears to be doing much better. Nonetheless it is about the same size as what we have seen previous I really think we need to try something a little different dressing wise to see if we can make a difference  in her healing. Electronic Signature(s) Signed: 09/08/2019 6:13:43 PM By: Lenda Kelp PA-C Entered By: Lenda Kelp on 09/08/2019 12:01:13 -------------------------------------------------------------------------------- Physician Orders Details Patient Name: Date of Service: Cheryl Mckinney, Cheryl Mckinney 09/08/2019 10:30 AM Medical Record Number:[id number] Patient Account Number: [id number] Date of Birth/Sex: Treating RN: 1966-04-08 (53 y.o. 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 - Wednesday Nurse Visit: - 1 week for rewrap Dressing Change Frequency Wound #1 Left,Lateral Lower Leg Do not change entire dressing for one week. Skin Barriers/Peri-Wound Care Moisturizing lotion TCA Cream or Ointment - mixed with lotion to leg Wound Cleansing May shower with protection. Primary Wound Dressing Wound #1 Left,Lateral Lower Leg Polymem Silver Secondary Dressing Wound #1 Left,Lateral Lower Leg Dry Gauze ABD pad Edema Control 4 layer compression: Left lower extremity Avoid standing for long periods of time Elevate legs to the level of the heart or above for 30 minutes daily and/or when sitting, a frequency of: - throughout the day Exercise regularly Additional Orders / Instructions Stop/Decrease Smoking Electronic Signature(s) Signed: 09/08/2019 6:13:43 PM By: Lenda Kelp PA-C Signed: 09/08/2019 6:50:57 PM By: Zandra Abts RN, BSN Entered By: Zandra Abts on 09/08/2019 11:59:45 -------------------------------------------------------------------------------- Problem List Details Patient  Name: Date  of Service: Cheryl Mckinney, Cheryl Mckinney 09/08/2019 10:30 AM Medical Record Number:[id number] Patient Account Number: [id number] Date of Birth/Sex: Treating RN: 1966-08-04 (53 y.o. Cheryl Mckinney Primary Care Provider: Cain Saupe Other Clinician: Referring Provider: Treating Provider/Extender:Stone III, Havery Moros, Cammie Weeks in Treatment: 30 Active Problems ICD-10 Evaluated Encounter Code Description Active Date Today Diagnosis I89.0 Lymphedema, not elsewhere classified 02/04/2019 No Yes L97.829 Non-pressure chronic ulcer of other part of left lower 02/04/2019 No Yes leg with unspecified severity Inactive Problems Resolved Problems Electronic Signature(s) Signed: 09/08/2019 6:13:43 PM By: Lenda Kelp PA-C Entered By: Lenda Kelp on 09/08/2019 11:51:18 -------------------------------------------------------------------------------- Progress Note Details Patient Name: Date of Service: Cheryl Mckinney, Cheryl Mckinney 09/08/2019 10:30 AM Medical Record Number:[id number] Patient Account Number: [id number] Date of Birth/Sex: Treating RN: Dec 20, 1965 (53 y.o. Cheryl Mckinney Primary Care Provider: Cain Saupe Other Clinician: Referring Provider: Treating Provider/Extender:Stone III, Havery Moros, Cammie Weeks in Treatment: 30 Subjective Chief Complaint Information obtained from Patient Leg ulcer with increased drainage History of Present Illness (HPI) Dysuria Cheryl Mckinney is a 53 year old African-American female with history of uncontrolled hypertension, remote history of left leg DVT, history of SVT, chronic left leg wound which has been in its present state for about a year with worsening which brings her to the clinic today. Patient was seen in the ED on March 6 of this year due to changes in the wound with malodorous drainage, she was evaluated and treated with Augmentin which she felt a week later and is continuing her course currently. Patient has a diagnosis of pyoderma gangrenosum however  this is not substantiated with any records about when or where this diagnosis was made. In reviewing her records it appears that she had a referral to dermatology but has not seen a dermatologist before. She has not had any biopsies of the left leg chronic ulcer before. She was anticoagulated for DVT in 2016 and at some point that was discontinued. More recently she had a Doppler study of the left leg that showed no evidence of DVT. She has never been to the wound clinic although she has had a referral in the past but had insurance and affordability issues and did not present to any wound clinic. Patient presents today with increasing drainage, leading to her frequently changing dressings on that leg. Patient has slight pain but denies any significant degree of pain.She has a fairly large ulcer on the left anterior shin area that was present for over a year and has enlarged but also has a new area of ulceration below it.She reports increasing drainage as a change that has become a problem recently 02/10/19 on evaluation today patient appears to be doing rather well in regard to her wound all things considering. She is having a lot of drainage however and there is a malodorous smell to the wound. She did again drain quite significantly even with the wrap. Nonetheless she has completed the doxycycline I'm wondering if this is really the best choice for her I did actually perform the wound culture today. No fevers, chills, nausea, or vomiting noted at this time. 02/24/19 on evaluation today patient appears to be doing well in regard to her left lower extremity ulcer. She's been tolerating the dressing changes without complication. With that being said I am pleased with the overall appearance today. 03/03/19 upon evaluation today patient appears to be doing excellent in regard to her left lower extremity. Overall I feel like she has been making excellent progress the wound is not hurting like it used to  and overall other than just having some pitching under the wrap she states that she is doing great. The itching is just around the wound not the whole leg I don't think she's having any kind of reaction to the wrap itself. Fortunately she seems to be doing very well at this point. 03/10/19 on evaluation today patient appears to be doing well in regard to the overall appearance of her wound. There is some Slough noted is gonna require sharp debridement today but fortunately nothing too significant. Overall I'm very pleased with how things are doing. She's not having any significant pain which is also good news. 03/24/19 on evaluation today patient actually appears to be doing excellent in regard to her lower extremity ulcer. She's been tolerating the dressing changes without complication. Fortunately there's no signs of active infection at this time. Overall I'm very pleased with the progress and how things are going currently. The patient likewise is extremely pleased she's not having any significant pain which is also good news. 03/31/19 upon evaluation today patient actually appears to be doing very well in regard to her lower Trinity ulcer. Several of the satellite lesions have completely closed and she actually is not having any significant pain which is good news. No fevers, chills, nausea, or vomiting noted at this time. 04/07/19 on evaluation today patient appears to be doing well in regard to her lower Trinity ulcer which is shown signs of good improvement. Fortunately there's no signs of active infection. Wound that appeared be somewhat dry but she tells me that you took the dressing off in order to shower this morning and has not really had a complete dressing on throughout the day. That is why this is dry. Other than that everything appears to be doing excellent. 04/28/19 on evaluation today patient appears to be doing very well in regard to her lower Trinity ulcer. She's been tolerating the  dressing changes without complication. Fortunately there is no signs of active infection at this time which is great news. Overall I'm extremely pleased with how things appear. 05/05/19 on evaluation today patient appears to be doing very well in regard to her lower than the also. She does have some Slough and biofilm noted on the surface of the wound but fortunately nothing too significant at this point. No fevers, chills, nausea, or vomiting noted at this time. 6/25; patient I do not usually see. She has lymphedema with just in the distal left calf and lower extremity. [Inverted bottle sign]. She is using Hydrofera Blue to her wound and this really looks quite healthy. 05/19/19 on evaluation today patient's wound bed actually appears to be doing excellent at this time. She has been tolerating the dressing changes including the wrap without complication and seems to be healing quite nicely. Overall very pleased in this regard. 06/16/2019 on evaluation today patient actually appears to be doing quite well with regard to her left lower extremity ulcer. This is shown signs of excellent improvement which is great news. There does not appear to be any evidence of infection and she is making wonderful progress as far as I am concerned. Since last time of seeing her this is a little less than half the size it was. 06/23/2019 on evaluation today patient appears to be doing great in regard to her ulcer. She has been tolerating the dressing changes without complication. Fortunately the wound is showing signs of improvement is measuring smaller today and overall looks better as well. 06/30/2019 upon evaluation today patient  actually appears to be doing well in regard to her leg ulcer. This really is not measuring much smaller compared to last evaluation. With that being said is not any larger either. I think we may want to just switch away from using the [facility] we can just use the collagen alone. 8/21;  patient I am seeing for the first time he has a venous insufficiency ulcer with surrounding stasis dermatitis. Apparently his wound is larger this week edema control is not as good. He is not complaining of pain. ABIs on the left were normal. 07/14/2019 upon evaluation today patient actually appears to be doing quite well with regard to her left lower extremity ulcer. She has been tolerating the dressing changes without complication. Fortunately she is showing signs of improvement this appears to be doing better than last week according to what I am hearing apparently last week her wound was not doing nearly as well. That was a switch from where things have been previous. 07/21/2019 on evaluation today patient appears to be doing quite well with regard to her wounds over the left lower extremity. She is showing some signs of additional epithelialization which is good news wound bed appears to be doing better which is also good news. Overall there is no signs of active infection at this time. 9/14; using Iodoflex on the left lower extremity wound. Has venous reflux studies booked for 9/23. Dimensions are improved 08/11/2019 on evaluation today patient actually appears to be doing about the same with regard to her wound of the left lateral lower extremity. She has been tolerating the dressing changes without complication. Fortunately there is no signs of active infection at this time. No fever chills noted she did have an appointment this morning for her venous study although that has to be rescheduled as she overslept and missed that appointment. 08/18/2019 on evaluation today patient appears to be doing excellent in regard to her wound. The measurements are actually quite a bit smaller this week which is good news. With that being said I am still concerned with the fact that she did test positive for Staphylococcus aureus on her culture that we obtained last week. I think we may want to see about going  ahead and initiate an antibiotic for her today. Other than that I feel like she is actually doing quite well. 08/25/2019 on evaluation today patient appears to be doing well with regard to her lower extremity ulcer. She has been tolerating the dressing changes without complication. Fortunately there is no signs of active infection at this time. She has not gotten the antibiotic picked up yet she was waiting for the payday. I thought she was going to be able to get it last week she tells me she cannot get until today on the way home. 09/08/2019 on evaluation today patient appears to be doing about the same with regard to her lower extremity ulcer unfortunately. There is no signs of active infection at this time she has been tolerating the dressing changes without any complication. With that being said I think it may be time for a change to see if we do something different to help her wound to heal more appropriately as she seems to be somewhat stalled in my opinion. Patient History Information obtained from Patient. Family History Cancer - Paternal Grandparents, Diabetes - Father,Siblings, No family history of Heart Disease, Hereditary Spherocytosis, Hypertension, Kidney Disease, Lung Disease, Seizures, Stroke, Thyroid Problems. Social History Current every day smoker - 1 pack per  week, Marital Status - Single, Alcohol Use - Rarely, Drug Use - Current History - Marijuana, Caffeine Use - Moderate - Soda. Medical History Eyes Denies history of Cataracts, Glaucoma, Optic Neuritis Ear/Nose/Mouth/Throat Denies history of Chronic sinus problems/congestion, Middle ear problems Hematologic/Lymphatic Denies history of Anemia, Hemophilia, Human Immunodeficiency Virus, Lymphedema, Sickle Cell Disease Respiratory Denies history of Aspiration, Asthma, Chronic Obstructive Pulmonary Disease (COPD), Pneumothorax, Sleep Apnea, Tuberculosis Cardiovascular Patient has history of Deep Vein Thrombosis - 2016  - left leg, Hypertension Denies history of Angina, Arrhythmia, Congestive Heart Failure, Coronary Artery Disease, Hypotension, Myocardial Infarction, Peripheral Arterial Disease, Peripheral Venous Disease, Phlebitis, Vasculitis Gastrointestinal Denies history of Cirrhosis , Colitis, Crohnoos, Hepatitis A, Hepatitis B, Hepatitis C Endocrine Denies history of Type I Diabetes, Type II Diabetes Genitourinary Denies history of End Stage Renal Disease Immunological Denies history of Lupus Erythematosus, Raynaudoos, Scleroderma Integumentary (Skin) Denies history of History of Burn Musculoskeletal Denies history of Gout, Rheumatoid Arthritis, Osteoarthritis, Osteomyelitis Neurologic Denies history of Dementia, Neuropathy, Quadriplegia, Paraplegia, Seizure Disorder Oncologic Denies history of Received Chemotherapy, Received Radiation Psychiatric Denies history of Anorexia/bulimia, Confinement Anxiety Review of Systems (ROS) Constitutional Symptoms (General Health) Denies complaints or symptoms of Fatigue, Fever, Chills, Marked Weight Change. Respiratory Denies complaints or symptoms of Chronic or frequent coughs, Shortness of Breath. Cardiovascular Denies complaints or symptoms of Chest pain. Psychiatric Denies complaints or symptoms of Claustrophobia, Suicidal. Objective Constitutional Well-nourished and well-hydrated in no acute distress. Vitals Time Taken: 11:31 AM, Height: 71 in, Weight: 225 lbs, BMI: 31.4, Temperature: 98.6 F, Pulse: 68 bpm, Respiratory Rate: 16 breaths/min, Blood Pressure: 142/75 mmHg. Respiratory normal breathing without difficulty. Psychiatric this patient is able to make decisions and demonstrates good insight into disease process. Alert and Oriented x 3. pleasant and cooperative. General Notes: Patient's wound bed currently did require some sharp debridement to remove some of the necrotic tissue from the surface of the wound she tolerated that today  without complication and post debridement wound bed appears to be doing much better. Nonetheless it is about the same size as what we have seen previous I really think we need to try something a little different dressing wise to see if we can make a difference in her healing. Integumentary (Hair, Skin) Wound #1 status is Open. Original cause of wound was Blister. The wound is located on the Left,Lateral Lower Leg. The wound measures 3.2cm length x 2.1cm width x 0.3cm depth; 5.278cm^2 area and 1.583cm^3 volume. There is Fat Layer (Subcutaneous Tissue) Exposed exposed. There is no tunneling or undermining noted. There is a medium amount of serosanguineous drainage noted. The wound margin is thickened. There is large (67-100%) red granulation within the wound bed. There is no necrotic tissue within the wound bed. Assessment Active Problems ICD-10 Lymphedema, not elsewhere classified Non-pressure chronic ulcer of other part of left lower leg with unspecified severity Procedures Wound #1 Pre-procedure diagnosis of Wound #1 is a Venous Leg Ulcer located on the Left,Lateral Lower Leg .Severity of Tissue Pre Debridement is: Fat layer exposed. There was a Excisional Skin/Subcutaneous Tissue Debridement with a total area of 6.72 sq cm performed by Lenda Kelp, PA. With the following instrument(s): Curette to remove Viable and Non-Viable tissue/material. Material removed includes Callus, Subcutaneous Tissue, and Slough after achieving pain control using Lidocaine 4% Topical Solution. No specimens were taken. A time out was conducted at 11:55, prior to the start of the procedure. A Minimum amount of bleeding was controlled with Pressure. The procedure was tolerated well with a pain  level of 4 throughout and a pain level of 2 following the procedure. Post Debridement Measurements: 3.2cm length x 2.1cm width x 0.3cm depth; 1.583cm^3 volume. Character of Wound/Ulcer Post Debridement is improved.  Severity of Tissue Post Debridement is: Fat layer exposed. Post procedure Diagnosis Wound #1: Same as Pre-Procedure Pre-procedure diagnosis of Wound #1 is a Venous Leg Ulcer located on the Left,Lateral Lower Leg . There was a Four Layer Compression Therapy Procedure by Zandra Abts, RN. Post procedure Diagnosis Wound #1: Same as Pre-Procedure Plan Follow-up Appointments: Return Appointment in 2 weeks. - Wednesday Nurse Visit: - 1 week for rewrap Dressing Change Frequency: Wound #1 Left,Lateral Lower Leg: Do not change entire dressing for one week. Skin Barriers/Peri-Wound Care: Moisturizing lotion TCA Cream or Ointment - mixed with lotion to leg Wound Cleansing: May shower with protection. Primary Wound Dressing: Wound #1 Left,Lateral Lower Leg: Polymem Silver Secondary Dressing: Wound #1 Left,Lateral Lower Leg: Dry Gauze ABD pad Edema Control: 4 layer compression: Left lower extremity Avoid standing for long periods of time Elevate legs to the level of the heart or above for 30 minutes daily and/or when sitting, a frequency of: - throughout the day Exercise regularly Additional Orders / Instructions: Stop/Decrease Smoking 1. I would recommend that we switch to PolyMem silver to see if that could be of benefit for her. 2. I am also going to suggest that we continue with a 4 layer compression wrap that seems to be doing well. 3. She has not gone back for her vascular studies that we recommended previous she has not rescheduled she missed the initial appointment. 4. I do still think that the patient needs to strongly consider stopping smoking I think that can definitely have a detrimental effect on the wound healing. With that being said we will continue to try to manage this as best we can. We will see patient back for reevaluation in 1 week here in the clinic. If anything worsens or changes patient will contact our office for additional recommendations. I will see her in 2  weeks for a visit in the clinic with me in 1 week she will have a nurse visit. Electronic Signature(s) Signed: 09/08/2019 6:13:43 PM By: Lenda Kelp PA-C Entered By: Lenda Kelp on 09/08/2019 12:02:09 -------------------------------------------------------------------------------- HxROS Details Patient Name: Date of Service: Cheryl Mckinney, Cheryl Mckinney 09/08/2019 10:30 AM Medical Record Number:[id number] Patient Account Number: [id number] Date of Birth/Sex: Treating RN: 1966/01/11 (53 y.o. Cheryl Mckinney Primary Care Provider: Cain Saupe Other Clinician: Referring Provider: Treating Provider/Extender:Stone III, Havery Moros, Cammie Weeks in Treatment: 30 Information Obtained From Patient Constitutional Symptoms (General Health) Complaints and Symptoms: Negative for: Fatigue; Fever; Chills; Marked Weight Change Respiratory Complaints and Symptoms: Negative for: Chronic or frequent coughs; Shortness of Breath Medical History: Negative for: Aspiration; Asthma; Chronic Obstructive Pulmonary Disease (COPD); Pneumothorax; Sleep Apnea; Tuberculosis Cardiovascular Complaints and Symptoms: Negative for: Chest pain Medical History: Positive for: Deep Vein Thrombosis - 2016 - left leg; Hypertension Negative for: Angina; Arrhythmia; Congestive Heart Failure; Coronary Artery Disease; Hypotension; Myocardial Infarction; Peripheral Arterial Disease; Peripheral Venous Disease; Phlebitis; Vasculitis Psychiatric Complaints and Symptoms: Negative for: Claustrophobia; Suicidal Medical History: Negative for: Anorexia/bulimia; Confinement Anxiety Eyes Medical History: Negative for: Cataracts; Glaucoma; Optic Neuritis Ear/Nose/Mouth/Throat Medical History: Negative for: Chronic sinus problems/congestion; Middle ear problems Hematologic/Lymphatic Medical History: Negative for: Anemia; Hemophilia; Human Immunodeficiency Virus; Lymphedema; Sickle Cell Disease Gastrointestinal Medical  History: Negative for: Cirrhosis ; Colitis; Crohns; Hepatitis A; Hepatitis B; Hepatitis C Endocrine Medical History: Negative for: Type I  Diabetes; Type II Diabetes Genitourinary Medical History: Negative for: End Stage Renal Disease Immunological Medical History: Negative for: Lupus Erythematosus; Raynauds; Scleroderma Integumentary (Skin) Medical History: Negative for: History of Burn Musculoskeletal Medical History: Negative for: Gout; Rheumatoid Arthritis; Osteoarthritis; Osteomyelitis Neurologic Medical History: Negative for: Dementia; Neuropathy; Quadriplegia; Paraplegia; Seizure Disorder Oncologic Medical History: Negative for: Received Chemotherapy; Received Radiation Immunizations Pneumococcal Vaccine: Received Pneumococcal Vaccination: No Implantable Devices None Family and Social History Cancer: Yes - Paternal Grandparents; Diabetes: Yes - Father,Siblings; Heart Disease: No; Hereditary Spherocytosis: No; Hypertension: No; Kidney Disease: No; Lung Disease: No; Seizures: No; Stroke: No; Thyroid Problems: No; Current every day smoker - 1 pack per week; Marital Status - Single; Alcohol Use: Rarely; Drug Use: Current History - Marijuana; Caffeine Use: Moderate - Soda Physician Affirmation I have reviewed and agree with the above information. Electronic Signature(s) Signed: 09/08/2019 6:13:43 PM By: Worthy Keeler PA-C Signed: 09/08/2019 6:50:57 PM By: Levan Hurst RN, BSN Entered By: Worthy Keeler on 09/08/2019 12:00:59 -------------------------------------------------------------------------------- SuperBill Details Patient Name: Date of Service: Cheryl Mckinney, Cheryl Mckinney 09/08/2019 Medical Record [id number] Patient Account Number: [id number] Date of Birth/Sex: Treating RN: 03-23-1966 (53 y.o. Nancy Fetter Primary Care Provider: Antony Blackbird Other Clinician: Referring Provider: Treating Provider/Extender:Stone III, Jillyn Hidden, Cammie Weeks in Treatment:  30 Diagnosis Coding ICD-10 Codes Code Description I89.0 Lymphedema, not elsewhere classified L97.829 Non-pressure chronic ulcer of other part of left lower leg with unspecified severity Facility Procedures CPT4 Code Description: [id number] - DEB SUBQ TISSUE 20 SQ CM/< ICD-10 Diagnosis Description L97.829 Non-pressure chronic ulcer of other part of left lower leg Modifier: with unspecifi Quantity: 1 ed severity Physician Procedures CPT4 Code Description: [id number] 11042 - WC PHYS SUBQ TISS 20 SQ CM ICD-10 Diagnosis Description L97.829 Non-pressure chronic ulcer of other part of left lower leg Modifier: with unspecifi Quantity: 1 ed severity Electronic Signature(s) Signed: 09/08/2019 6:13:43 PM By: Worthy Keeler PA-C Entered By: Worthy Keeler on 09/08/2019 12:02:16

## 2019-09-10 ENCOUNTER — Ambulatory Visit: Payer: Medicaid Other | Admitting: Family Medicine

## 2019-09-15 ENCOUNTER — Encounter (HOSPITAL_BASED_OUTPATIENT_CLINIC_OR_DEPARTMENT_OTHER): Payer: Self-pay | Admitting: Physician Assistant

## 2019-09-15 ENCOUNTER — Encounter (HOSPITAL_BASED_OUTPATIENT_CLINIC_OR_DEPARTMENT_OTHER): Payer: Medicaid Other | Admitting: Physician Assistant

## 2019-09-15 ENCOUNTER — Other Ambulatory Visit: Payer: Self-pay

## 2019-09-22 ENCOUNTER — Encounter (HOSPITAL_BASED_OUTPATIENT_CLINIC_OR_DEPARTMENT_OTHER): Payer: Medicaid Other | Attending: Physician Assistant | Admitting: Physician Assistant

## 2019-09-22 ENCOUNTER — Other Ambulatory Visit: Payer: Self-pay

## 2019-09-22 DIAGNOSIS — L97822 Non-pressure chronic ulcer of other part of left lower leg with fat layer exposed: Secondary | ICD-10-CM | POA: Insufficient documentation

## 2019-09-22 DIAGNOSIS — I890 Lymphedema, not elsewhere classified: Secondary | ICD-10-CM | POA: Insufficient documentation

## 2019-09-22 DIAGNOSIS — F17210 Nicotine dependence, cigarettes, uncomplicated: Secondary | ICD-10-CM | POA: Insufficient documentation

## 2019-09-22 DIAGNOSIS — Z86718 Personal history of other venous thrombosis and embolism: Secondary | ICD-10-CM | POA: Insufficient documentation

## 2019-09-22 DIAGNOSIS — I10 Essential (primary) hypertension: Secondary | ICD-10-CM | POA: Insufficient documentation

## 2019-09-22 NOTE — Progress Notes (Signed)
AZAYLEA, MAVES ([id number]) Visit Report for 09/15/2019 SuperBill Details Patient Name: Date of Service: ANDRA, HESLIN 09/15/2019 Medical Record [id number] Patient Account Number: [id number] Date of Birth/Sex: Treating RN: [birth year] (53 y.o. Elam Dutch Primary Care Provider: Antony Blackbird Other Clinician: Referring Provider: Treating Provider/Extender:Stone III, Jillyn Hidden, Cammie Weeks in Treatment: 31 Diagnosis Coding ICD-10 Codes Code Description I89.0 Lymphedema, not elsewhere classified L97.829 Non-pressure chronic ulcer of other part of left lower leg with unspecified severity Facility Procedures CPT4 Code Description Modifier Quantity [id number] (Facility Use Only) [phone] - APPLY [facility] Electronic Signature(s) Signed: 09/15/2019 1:37:03 PM By: Baruch Gouty RN, BSN Signed: 09/22/2019 6:48:11 PM By: Worthy Keeler PA-C Entered By: Baruch Gouty on 09/15/2019 12:04:12

## 2019-09-22 NOTE — Progress Notes (Signed)
[name], [name] ([id number]) Visit Report for 09/22/2019 Chief Complaint Document Details Patient Name: Date of Service: Cheryl Mckinney, Cheryl Mckinney 09/22/2019 10:00 AM Medical Record Number:[id number] Patient Account Number: [id number] Date of Birth/Sex: Treating RN: 1966-02-16 (53 y.o. Cheryl Mckinney Primary Care Provider: Cain Saupe Other Clinician: Referring Provider: Treating Provider/Extender:Stone III, Havery Moros, Cammie Weeks in Treatment: 32 Information Obtained from: Patient Chief Complaint Leg ulcer with increased drainage Electronic Signature(s) Signed: 09/22/2019 6:48:11 PM By: Lenda Kelp PA-C Entered By: Lenda Kelp on 09/22/2019 11:05:09 -------------------------------------------------------------------------------- Debridement Details Patient Name: Date of Service: Cheryl Mckinney, Cheryl Mckinney 09/22/2019 10:00 AM Medical Record Number:[id number] Patient Account Number: [id number] Date of Birth/Sex: Treating RN: September 21, 1966 (53 y.o. Cheryl Mckinney Primary Care Provider: Cain Saupe Other Clinician: Referring Provider: Treating Provider/Extender:Stone III, Havery Moros, Cammie Weeks in Treatment: 32 Debridement Performed for Wound #1 Left,Lateral Lower Leg Assessment: Performed By: Physician Lenda Kelp, PA Debridement Type: Debridement Severity of Tissue Pre Fat layer exposed Debridement: Level of Consciousness (Pre- Awake and Alert procedure): Pre-procedure Verification/Time Out Taken: Yes - 12:55 Start Time: 12:55 Pain Control: Lidocaine 5% topical ointment Total Area Debrided (L x W): 2.5 (cm) x 1.3 (cm) = 3.25 (cm) Tissue and other material Viable, Non-Viable, Slough, Subcutaneous, Skin: Epidermis, Slough debrided: Level: Skin/Subcutaneous Tissue Debridement Description: Excisional Instrument: Curette Bleeding: Minimum Hemostasis Achieved: Pressure End Time: 12:58 Procedural Pain: 2 Post Procedural Pain: 0 Response to Treatment: Procedure was tolerated  well Level of Consciousness Awake and Alert (Post-procedure): Post Debridement Measurements of Total Wound Length: (cm) 2.5 Width: (cm) 1.3 Depth: (cm) 0.1 Volume: (cm) 0.255 Character of Wound/Ulcer Post Improved Debridement: Severity of Tissue Post Debridement: Fat layer exposed Post Procedure Diagnosis Same as Pre-procedure Electronic Signature(s) Signed: 09/22/2019 6:42:58 PM By: Zenaida Deed RN, BSN Signed: 09/22/2019 6:48:11 PM By: Lenda Kelp PA-C Entered By: Zenaida Deed on 09/22/2019 12:57:27 -------------------------------------------------------------------------------- HPI Details Patient Name: Date of Service: Cheryl Mckinney, Cheryl Mckinney 09/22/2019 10:00 AM Medical Record Number:[id number] Patient Account Number: [id number] Date of Birth/Sex: Treating RN: November 15, 1966 (53 y.o. Cheryl Mckinney Primary Care Provider: Cain Saupe Other Clinician: Referring Provider: Treating Provider/Extender:Stone III, Havery Moros, Cammie Weeks in Treatment: 32 History of Present Illness HPI Description: Dysuria Cheryl Mckinney is a 53 year old African-American female with history of uncontrolled hypertension, remote history of left leg DVT, history of SVT, chronic left leg wound which has been in its present state for about a year with worsening which brings her to the clinic today. Patient was seen in the ED on March 6 of this year due to changes in the wound with malodorous drainage, she was evaluated and treated with Augmentin which she felt a week later and is continuing her course currently. Patient has a diagnosis of pyoderma gangrenosum however this is not substantiated with any records about when or where this diagnosis was made. In reviewing her records it appears that she had a referral to dermatology but has not seen a dermatologist before. She has not had any biopsies of the left leg chronic ulcer before. She was anticoagulated for DVT in 2016 and at some point that was discontinued.  More recently she had a Doppler study of the left leg that showed no evidence of DVT. She has never been to the wound clinic although she has had a referral in the past but had insurance and affordability issues and did not present to any wound clinic. Patient presents today with increasing drainage, leading to her frequently changing dressings on that leg. Patient has slight pain but denies any  significant degree of pain.She has a fairly large ulcer on the left anterior shin area that was present for over a year and has enlarged but also has a new area of ulceration below it.She reports increasing drainage as a change that has become a problem recently 02/10/19 on evaluation today patient appears to be doing rather well in regard to her wound all things considering. She is having a lot of drainage however and there is a malodorous smell to the wound. She did again drain quite significantly even with the wrap. Nonetheless she has completed the doxycycline I'm wondering if this is really the best choice for her I did actually perform the wound culture today. No fevers, chills, nausea, or vomiting noted at this time. 02/24/19 on evaluation today patient appears to be doing well in regard to her left lower extremity ulcer. She's been tolerating the dressing changes without complication. With that being said I am pleased with the overall appearance today. 03/03/19 upon evaluation today patient appears to be doing excellent in regard to her left lower extremity. Overall I feel like she has been making excellent progress the wound is not hurting like it used to and overall other than just having some pitching under the wrap she states that she is doing great. The itching is just around the wound not the whole leg I don't think she's having any kind of reaction to the wrap itself. Fortunately she seems to be doing very well at this point. 03/10/19 on evaluation today patient appears to be doing well in  regard to the overall appearance of her wound. There is some Slough noted is gonna require sharp debridement today but fortunately nothing too significant. Overall I'm very pleased with how things are doing. She's not having any significant pain which is also good news. 03/24/19 on evaluation today patient actually appears to be doing excellent in regard to her lower extremity ulcer. She's been tolerating the dressing changes without complication. Fortunately there's no signs of active infection at this time. Overall I'm very pleased with the progress and how things are going currently. The patient likewise is extremely pleased she's not having any significant pain which is also good news. 03/31/19 upon evaluation today patient actually appears to be doing very well in regard to her lower Trinity ulcer. Several of the satellite lesions have completely closed and she actually is not having any significant pain which is good news. No fevers, chills, nausea, or vomiting noted at this time. 04/07/19 on evaluation today patient appears to be doing well in regard to her lower Trinity ulcer which is shown signs of good improvement. Fortunately there's no signs of active infection. Wound that appeared be somewhat dry but she tells me that you took the dressing off in order to shower this morning and has not really had a complete dressing on throughout the day. That is why this is dry. Other than that everything appears to be doing excellent. 04/28/19 on evaluation today patient appears to be doing very well in regard to her lower Trinity ulcer. She's been tolerating the dressing changes without complication. Fortunately there is no signs of active infection at this time which is great news. Overall I'm extremely pleased with how things appear. 05/05/19 on evaluation today patient appears to be doing very well in regard to her lower than the also. She does have some Slough and biofilm noted on the surface of the  wound but fortunately nothing too significant at this point. No fevers,  chills, nausea, or vomiting noted at this time. 6/25; patient I do not usually see. She has lymphedema with just in the distal left calf and lower extremity. [Inverted bottle sign]. She is using Hydrofera Blue to her wound and this really looks quite healthy. 05/19/19 on evaluation today patient's wound bed actually appears to be doing excellent at this time. She has been tolerating the dressing changes including the wrap without complication and seems to be healing quite nicely. Overall very pleased in this regard. 06/16/2019 on evaluation today patient actually appears to be doing quite well with regard to her left lower extremity ulcer. This is shown signs of excellent improvement which is great news. There does not appear to be any evidence of infection and she is making wonderful progress as far as I am concerned. Since last time of seeing her this is a little less than half the size it was. 06/23/2019 on evaluation today patient appears to be doing great in regard to her ulcer. She has been tolerating the dressing changes without complication. Fortunately the wound is showing signs of improvement is measuring smaller today and overall looks better as well. 06/30/2019 upon evaluation today patient actually appears to be doing well in regard to her leg ulcer. This really is not measuring much smaller compared to last evaluation. With that being said is not any larger either. I think we may want to just switch away from using the [facility] we can just use the collagen alone. 8/21; patient I am seeing for the first time he has a venous insufficiency ulcer with surrounding stasis dermatitis. Apparently his wound is larger this week edema control is not as good. He is not complaining of pain. ABIs on the left were normal. 07/14/2019 upon evaluation today patient actually appears to be doing quite well with regard to her left  lower extremity ulcer. She has been tolerating the dressing changes without complication. Fortunately she is showing signs of improvement this appears to be doing better than last week according to what I am hearing apparently last week her wound was not doing nearly as well. That was a switch from where things have been previous. 07/21/2019 on evaluation today patient appears to be doing quite well with regard to her wounds over the left lower extremity. She is showing some signs of additional epithelialization which is good news wound bed appears to be doing better which is also good news. Overall there is no signs of active infection at this time. 9/14; using Iodoflex on the left lower extremity wound. Has venous reflux studies booked for 9/23. Dimensions are improved 08/11/2019 on evaluation today patient actually appears to be doing about the same with regard to her wound of the left lateral lower extremity. She has been tolerating the dressing changes without complication. Fortunately there is no signs of active infection at this time. No fever chills noted she did have an appointment this morning for her venous study although that has to be rescheduled as she overslept and missed that appointment. 08/18/2019 on evaluation today patient appears to be doing excellent in regard to her wound. The measurements are actually quite a bit smaller this week which is good news. With that being said I am still concerned with the fact that she did test positive for Staphylococcus aureus on her culture that we obtained last week. I think we may want to see about going ahead and initiate an antibiotic for her today. Other than that I feel like she  is actually doing quite well. 08/25/2019 on evaluation today patient appears to be doing well with regard to her lower extremity ulcer. She has been tolerating the dressing changes without complication. Fortunately there is no signs of active infection at this time.  She has not gotten the antibiotic picked up yet she was waiting for the payday. I thought she was going to be able to get it last week she tells me she cannot get until today on the way home. 09/08/2019 on evaluation today patient appears to be doing about the same with regard to her lower extremity ulcer unfortunately. There is no signs of active infection at this time she has been tolerating the dressing changes without any complication. With that being said I think it may be time for a change to see if we do something different to help her wound to heal more appropriately as she seems to be somewhat stalled in my opinion. 09/22/2019 on evaluation today patient actually appears to be doing much better with regard to her wound. She has been tolerating the dressing changes without complication. Fortunately there is no signs of active infection at this time. No fevers, chills, nausea, vomiting, or diarrhea. Electronic Signature(s) Signed: 09/22/2019 6:48:11 PM By: Lenda Kelp PA-C Entered By: Lenda Kelp on 09/22/2019 18:32:22 -------------------------------------------------------------------------------- Physical Exam Details Patient Name: Date of Service: Cheryl Mckinney, Cheryl Mckinney 09/22/2019 10:00 AM Medical Record Number:[id number] Patient Account Number: [id number] Date of Birth/Sex: Treating RN: 1966/06/25 (53 y.o. Cheryl Mckinney Primary Care Provider: Cain Saupe Other Clinician: Referring Provider: Treating Provider/Extender:Stone III, Havery Moros, Cammie Weeks in Treatment: 32 Constitutional Well-nourished and well-hydrated in no acute distress. Respiratory normal breathing without difficulty. clear to auscultation bilaterally. Cardiovascular regular rate and rhythm with normal S1, S2. Psychiatric this patient is able to make decisions and demonstrates good insight into disease process. Alert and Oriented x 3. pleasant and cooperative. Notes Upon evaluation today patient's wound  bed did require some sharp debridement to clear away some of the necrotic tissue from the surface of the wound. She tolerated this without any significant pain post debridement the wound bed appears to be doing much better she is healing quite nicely yet again and I am pleased in this regard. Electronic Signature(s) Signed: 09/22/2019 6:48:11 PM By: Lenda Kelp PA-C Entered By: Lenda Kelp on 09/22/2019 18:32:45 -------------------------------------------------------------------------------- Physician Orders Details Patient Name: Date of Service: TACIA, HINDLEY 09/22/2019 10:00 AM Medical Record Number:[id number] Patient Account Number: [id number] Date of Birth/Sex: Treating RN: [birth year] (53 y.o. 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 Dressing Change Frequency Wound #1 Left,Lateral Lower Leg Do not change entire dressing for one week. Skin Barriers/Peri-Wound Care Moisturizing lotion TCA Cream or Ointment - mixed with lotion to leg Wound Cleansing May shower with protection. Primary Wound Dressing Wound #1 Left,Lateral Lower Leg Polymem Silver Secondary Dressing Wound #1 Left,Lateral Lower Leg Dry Gauze Edema Control 4 layer compression: Left lower extremity Avoid standing for long periods of time Elevate legs to the level of the heart or above for 30 minutes daily and/or when sitting, a frequency of: - throughout the day Exercise regularly Additional Orders / Instructions Stop/Decrease Smoking Electronic Signature(s) Signed: 09/22/2019 6:42:58 PM By:  Eather ColasBoehlein, Linda RN,  BSN Signed: 09/22/2019 6:48:11 PM By: Lenda KelpStone III, Hester Joslin PA-C Entered By: Zenaida DeedBoehlein, Linda on 09/22/2019 12:58:10 -------------------------------------------------------------------------------- Problem List Details Patient Name: Date of Service: Cheryl Mckinney, Lailana 09/22/2019 10:00 AM Medical Record Number:[id number] Patient Account Number: [id number] Date of Birth/Sex: Treating RN: [birth year] (53 y.o. Cheryl StandardF) Boehlein, Linda Primary Care Provider: Cain SaupeFulp, Cammie Other Clinician: Referring Provider: Treating Provider/Extender:Stone III, Havery MorosHoyt Fulp, Cammie Weeks in Treatment: 32 Active Problems ICD-10 Evaluated Encounter Code Description Active Date Today Diagnosis I89.0 Lymphedema, not elsewhere classified 02/04/2019 No Yes L97.829 Non-pressure chronic ulcer of other part of left lower 02/04/2019 No Yes leg with unspecified severity Inactive Problems Resolved Problems Electronic Signature(s) Signed: 09/22/2019 6:48:11 PM By: Lenda KelpStone III, Anjeanette Petzold PA-C Entered By: Lenda KelpStone III, Sarann Tregre on 09/22/2019 11:05:04 -------------------------------------------------------------------------------- Progress Note Details Patient Name: Date of Service: Cheryl Mckinney, Myeasha 09/22/2019 10:00 AM Medical Record Number:[id number] Patient Account Number: [id number] Date of Birth/Sex: Treating RN: [birth year] (53 y.o. Cheryl StandardF) Boehlein, Linda Primary Care Provider: Cain SaupeFulp, Cammie Other Clinician: Referring Provider: Treating Provider/Extender:Stone III, Havery MorosHoyt Fulp, Cammie Weeks in Treatment: 32 Subjective Chief Complaint Information obtained from Patient Leg ulcer with increased drainage History of Present Illness (HPI) Dysuria Cheryl Mckinney is a 53 year old African-American female with history of uncontrolled hypertension, remote history of left leg DVT, history of SVT, chronic left leg wound which has been in its present state for about a year with worsening which brings her to the clinic today. Patient was seen in the ED on March 6 of this year  due to changes in the wound with malodorous drainage, she was evaluated and treated with Augmentin which she felt a week later and is continuing her course currently. Patient has a diagnosis of pyoderma gangrenosum however this is not substantiated with any records about when or where this diagnosis was made. In reviewing her records it appears that she had a referral to dermatology but has not seen a dermatologist before. She has not had any biopsies of the left leg chronic ulcer before. She was anticoagulated for DVT in 2016 and at some point that was discontinued. More recently she had a Doppler study of the left leg that showed no evidence of DVT. She has never been to the wound clinic although she has had a referral in the past but had insurance and affordability issues and did not present to any wound clinic. Patient presents today with increasing drainage, leading to her frequently changing dressings on that leg. Patient has slight pain but denies any significant degree of pain.She has a fairly large ulcer on the left anterior shin area that was present for over a year and has enlarged but also has a new area of ulceration below it.She reports increasing drainage as a change that has become a problem recently 02/10/19 on evaluation today patient appears to be doing rather well in regard to her wound all things considering. She is having a lot of drainage however and there is a malodorous smell to the wound. She did again drain quite significantly even with the wrap. Nonetheless she has completed the doxycycline I'm wondering if this is really the best choice for her I did actually perform the wound culture today. No fevers, chills, nausea, or vomiting noted at this time. 02/24/19 on evaluation today patient appears to be doing well in regard to her left lower extremity ulcer. She's been tolerating the dressing changes without complication. With that being said I am pleased with the overall  appearance today. 03/03/19 upon evaluation today patient appears to be  doing excellent in regard to her left lower extremity. Overall I feel like she has been making excellent progress the wound is not hurting like it used to and overall other than just having some pitching under the wrap she states that she is doing great. The itching is just around the wound not the whole leg I don't think she's having any kind of reaction to the wrap itself. Fortunately she seems to be doing very well at this point. 03/10/19 on evaluation today patient appears to be doing well in regard to the overall appearance of her wound. There is some Slough noted is gonna require sharp debridement today but fortunately nothing too significant. Overall I'm very pleased with how things are doing. She's not having any significant pain which is also good news. 03/24/19 on evaluation today patient actually appears to be doing excellent in regard to her lower extremity ulcer. She's been tolerating the dressing changes without complication. Fortunately there's no signs of active infection at this time. Overall I'm very pleased with the progress and how things are going currently. The patient likewise is extremely pleased she's not having any significant pain which is also good news. 03/31/19 upon evaluation today patient actually appears to be doing very well in regard to her lower Trinity ulcer. Several of the satellite lesions have completely closed and she actually is not having any significant pain which is good news. No fevers, chills, nausea, or vomiting noted at this time. 04/07/19 on evaluation today patient appears to be doing well in regard to her lower Trinity ulcer which is shown signs of good improvement. Fortunately there's no signs of active infection. Wound that appeared be somewhat dry but she tells me that you took the dressing off in order to shower this morning and has not really had a complete dressing on  throughout the day. That is why this is dry. Other than that everything appears to be doing excellent. 04/28/19 on evaluation today patient appears to be doing very well in regard to her lower Trinity ulcer. She's been tolerating the dressing changes without complication. Fortunately there is no signs of active infection at this time which is great news. Overall I'm extremely pleased with how things appear. 05/05/19 on evaluation today patient appears to be doing very well in regard to her lower than the also. She does have some Slough and biofilm noted on the surface of the wound but fortunately nothing too significant at this point. No fevers, chills, nausea, or vomiting noted at this time. 6/25; patient I do not usually see. She has lymphedema with just in the distal left calf and lower extremity. [Inverted bottle sign]. She is using Hydrofera Blue to her wound and this really looks quite healthy. 05/19/19 on evaluation today patient's wound bed actually appears to be doing excellent at this time. She has been tolerating the dressing changes including the wrap without complication and seems to be healing quite nicely. Overall very pleased in this regard. 06/16/2019 on evaluation today patient actually appears to be doing quite well with regard to her left lower extremity ulcer. This is shown signs of excellent improvement which is great news. There does not appear to be any evidence of infection and she is making wonderful progress as far as I am concerned. Since last time of seeing her this is a little less than half the size it was. 06/23/2019 on evaluation today patient appears to be doing great in regard to her ulcer. She has been tolerating  the dressing changes without complication. Fortunately the wound is showing signs of improvement is measuring smaller today and overall looks better as well. 06/30/2019 upon evaluation today patient actually appears to be doing well in regard to her leg ulcer.  This really is not measuring much smaller compared to last evaluation. With that being said is not any larger either. I think we may want to just switch away from using the [facility] we can just use the collagen alone. 8/21; patient I am seeing for the first time he has a venous insufficiency ulcer with surrounding stasis dermatitis. Apparently his wound is larger this week edema control is not as good. He is not complaining of pain. ABIs on the left were normal. 07/14/2019 upon evaluation today patient actually appears to be doing quite well with regard to her left lower extremity ulcer. She has been tolerating the dressing changes without complication. Fortunately she is showing signs of improvement this appears to be doing better than last week according to what I am hearing apparently last week her wound was not doing nearly as well. That was a switch from where things have been previous. 07/21/2019 on evaluation today patient appears to be doing quite well with regard to her wounds over the left lower extremity. She is showing some signs of additional epithelialization which is good news wound bed appears to be doing better which is also good news. Overall there is no signs of active infection at this time. 9/14; using Iodoflex on the left lower extremity wound. Has venous reflux studies booked for 9/23. Dimensions are improved 08/11/2019 on evaluation today patient actually appears to be doing about the same with regard to her wound of the left lateral lower extremity. She has been tolerating the dressing changes without complication. Fortunately there is no signs of active infection at this time. No fever chills noted she did have an appointment this morning for her venous study although that has to be rescheduled as she overslept and missed that appointment. 08/18/2019 on evaluation today patient appears to be doing excellent in regard to her wound. The measurements are actually quite a  bit smaller this week which is good news. With that being said I am still concerned with the fact that she did test positive for Staphylococcus aureus on her culture that we obtained last week. I think we may want to see about going ahead and initiate an antibiotic for her today. Other than that I feel like she is actually doing quite well. 08/25/2019 on evaluation today patient appears to be doing well with regard to her lower extremity ulcer. She has been tolerating the dressing changes without complication. Fortunately there is no signs of active infection at this time. She has not gotten the antibiotic picked up yet she was waiting for the payday. I thought she was going to be able to get it last week she tells me she cannot get until today on the way home. 09/08/2019 on evaluation today patient appears to be doing about the same with regard to her lower extremity ulcer unfortunately. There is no signs of active infection at this time she has been tolerating the dressing changes without any complication. With that being said I think it may be time for a change to see if we do something different to help her wound to heal more appropriately as she seems to be somewhat stalled in my opinion. 09/22/2019 on evaluation today patient actually appears to be doing much better with regard  to her wound. She has been tolerating the dressing changes without complication. Fortunately there is no signs of active infection at this time. No fevers, chills, nausea, vomiting, or diarrhea. Patient History Information obtained from Patient. Family History Cancer - Paternal Grandparents, Diabetes - Father,Siblings, No family history of Heart Disease, Hereditary Spherocytosis, Hypertension, Kidney Disease, Lung Disease, Seizures, Stroke, Thyroid Problems. Social History Current every day smoker - 1 pack per week, Marital Status - Single, Alcohol Use - Rarely, Drug Use - Current History - Marijuana, Caffeine Use -  Moderate - Soda. Medical History Eyes Denies history of Cataracts, Glaucoma, Optic Neuritis Ear/Nose/Mouth/Throat Denies history of Chronic sinus problems/congestion, Middle ear problems Hematologic/Lymphatic Denies history of Anemia, Hemophilia, Human Immunodeficiency Virus, Lymphedema, Sickle Cell Disease Respiratory Denies history of Aspiration, Asthma, Chronic Obstructive Pulmonary Disease (COPD), Pneumothorax, Sleep Apnea, Tuberculosis Cardiovascular Patient has history of Deep Vein Thrombosis - 2016 - left leg, Hypertension Denies history of Angina, Arrhythmia, Congestive Heart Failure, Coronary Artery Disease, Hypotension, Myocardial Infarction, Peripheral Arterial Disease, Peripheral Venous Disease, Phlebitis, Vasculitis Gastrointestinal Denies history of Cirrhosis , Colitis, Crohnoos, Hepatitis A, Hepatitis B, Hepatitis C Endocrine Denies history of Type I Diabetes, Type II Diabetes Genitourinary Denies history of End Stage Renal Disease Immunological Denies history of Lupus Erythematosus, Raynaudoos, Scleroderma Integumentary (Skin) Denies history of History of Burn Musculoskeletal Denies history of Gout, Rheumatoid Arthritis, Osteoarthritis, Osteomyelitis Neurologic Denies history of Dementia, Neuropathy, Quadriplegia, Paraplegia, Seizure Disorder Oncologic Denies history of Received Chemotherapy, Received Radiation Psychiatric Denies history of Anorexia/bulimia, Confinement Anxiety Review of Systems (ROS) Constitutional Symptoms (General Health) Denies complaints or symptoms of Fatigue, Fever, Chills, Marked Weight Change. Respiratory Denies complaints or symptoms of Chronic or frequent coughs, Shortness of Breath. Cardiovascular Denies complaints or symptoms of Chest pain. Psychiatric Denies complaints or symptoms of Claustrophobia, Suicidal. Objective Constitutional Well-nourished and well-hydrated in no acute distress. Vitals Time Taken: 12:06 PM, Height:  71 in, Weight: 225 lbs, BMI: 31.4, Temperature: 98.7 F, Pulse: 65 bpm, Respiratory Rate: 16 breaths/min, Blood Pressure: 140/69 mmHg. Respiratory normal breathing without difficulty. clear to auscultation bilaterally. Cardiovascular regular rate and rhythm with normal S1, S2. Psychiatric this patient is able to make decisions and demonstrates good insight into disease process. Alert and Oriented x 3. pleasant and cooperative. General Notes: Upon evaluation today patient's wound bed did require some sharp debridement to clear away some of the necrotic tissue from the surface of the wound. She tolerated this without any significant pain post debridement the wound bed appears to be doing much better she is healing quite nicely yet again and I am pleased in this regard. Integumentary (Hair, Skin) Wound #1 status is Open. Original cause of wound was Blister. The wound is located on the Left,Lateral Lower Leg. The wound measures 2.5cm length x 1.3cm width x 0.1cm depth; 2.553cm^2 area and 0.255cm^3 volume. There is Fat Layer (Subcutaneous Tissue) Exposed exposed. There is no tunneling or undermining noted. There is a medium amount of serosanguineous drainage noted. The wound margin is thickened. There is large (67-100%) red granulation within the wound bed. There is a small (1-33%) amount of necrotic tissue within the wound bed including Adherent Slough. Assessment Active Problems ICD-10 Lymphedema, not elsewhere classified Non-pressure chronic ulcer of other part of left lower leg with unspecified severity Procedures Wound #1 Pre-procedure diagnosis of Wound #1 is a Venous Leg Ulcer located on the Left,Lateral Lower Leg .Severity of Tissue Pre Debridement is: Fat layer exposed. There was a Excisional Skin/Subcutaneous Tissue Debridement with a total area  of 3.25 sq cm performed by Lenda Kelp, PA. With the following instrument(s): Curette to remove Viable and Non-Viable  tissue/material. Material removed includes Subcutaneous Tissue, Slough, and Skin: Epidermis after achieving pain control using Lidocaine 5% topical ointment. No specimens were taken. A time out was conducted at 12:55, prior to the start of the procedure. A Minimum amount of bleeding was controlled with Pressure. The procedure was tolerated well with a pain level of 2 throughout and a pain level of 0 following the procedure. Post Debridement Measurements: 2.5cm length x 1.3cm width x 0.1cm depth; 0.255cm^3 volume. Character of Wound/Ulcer Post Debridement is improved. Severity of Tissue Post Debridement is: Fat layer exposed. Post procedure Diagnosis Wound #1: Same as Pre-Procedure Pre-procedure diagnosis of Wound #1 is a Venous Leg Ulcer located on the Left,Lateral Lower Leg . There was a Four Layer Compression Therapy Procedure by Zandra Abts, RN. Post procedure Diagnosis Wound #1: Same as Pre-Procedure Plan Follow-up Appointments: Return Appointment in 1 week. Dressing Change Frequency: Wound #1 Left,Lateral Lower Leg: Do not change entire dressing for one week. Skin Barriers/Peri-Wound Care: Moisturizing lotion TCA Cream or Ointment - mixed with lotion to leg Wound Cleansing: May shower with protection. Primary Wound Dressing: Wound #1 Left,Lateral Lower Leg: Polymem Silver Secondary Dressing: Wound #1 Left,Lateral Lower Leg: Dry Gauze Edema Control: 4 layer compression: Left lower extremity Avoid standing for long periods of time Elevate legs to the level of the heart or above for 30 minutes daily and/or when sitting, a frequency of: - throughout the day Exercise regularly Additional Orders / Instructions: Stop/Decrease Smoking 1. My suggestion is that we going continue with the current wound care measures at this time since the patient seems to be doing so well. This includes the PolyMem silver dressing. 2. I am also going to recommend that we continue with the 4 layer  compression wrap since that also seems to be beneficial for the patient. 3. I think she can still be physically active but I do recommend that she elevate her legs as much as possible when she is not working. We will see patient back for reevaluation in 1 week here in the clinic. If anything worsens or changes patient will contact our office for additional recommendations. Electronic Signature(s) Signed: 09/22/2019 6:48:11 PM By: Lenda Kelp PA-C Entered By: Lenda Kelp on 09/22/2019 18:33:42 -------------------------------------------------------------------------------- HxROS Details Patient Name: Date of Service: Cheryl Mckinney, Cheryl Mckinney 09/22/2019 10:00 AM Medical Record Number:[id number] Patient Account Number: [id number] Date of Birth/Sex: Treating RN: 1966-09-15 (53 y.o. Cheryl Mckinney Primary Care Provider: Cain Saupe Other Clinician: Referring Provider: Treating Provider/Extender:Stone III, Havery Moros, Cammie Weeks in Treatment: 32 Information Obtained From Patient Constitutional Symptoms (General Health) Complaints and Symptoms: Negative for: Fatigue; Fever; Chills; Marked Weight Change Respiratory Complaints and Symptoms: Negative for: Chronic or frequent coughs; Shortness of Breath Medical History: Negative for: Aspiration; Asthma; Chronic Obstructive Pulmonary Disease (COPD); Pneumothorax; Sleep Apnea; Tuberculosis Cardiovascular Complaints and Symptoms: Negative for: Chest pain Medical History: Positive for: Deep Vein Thrombosis - 2016 - left leg; Hypertension Negative for: Angina; Arrhythmia; Congestive Heart Failure; Coronary Artery Disease; Hypotension; Myocardial Infarction; Peripheral Arterial Disease; Peripheral Venous Disease; Phlebitis; Vasculitis Psychiatric Complaints and Symptoms: Negative for: Claustrophobia; Suicidal Medical History: Negative for: Anorexia/bulimia; Confinement Anxiety Eyes Medical History: Negative for: Cataracts; Glaucoma;  Optic Neuritis Ear/Nose/Mouth/Throat Medical History: Negative for: Chronic sinus problems/congestion; Middle ear problems Hematologic/Lymphatic Medical History: Negative for: Anemia; Hemophilia; Human Immunodeficiency Virus; Lymphedema; Sickle Cell Disease Gastrointestinal Medical History: Negative for: Cirrhosis ;  Colitis; Crohns; Hepatitis A; Hepatitis B; Hepatitis C Endocrine Medical History: Negative for: Type I Diabetes; Type II Diabetes Genitourinary Medical History: Negative for: End Stage Renal Disease Immunological Medical History: Negative for: Lupus Erythematosus; Raynauds; Scleroderma Integumentary (Skin) Medical History: Negative for: History of Burn Musculoskeletal Medical History: Negative for: Gout; Rheumatoid Arthritis; Osteoarthritis; Osteomyelitis Neurologic Medical History: Negative for: Dementia; Neuropathy; Quadriplegia; Paraplegia; Seizure Disorder Oncologic Medical History: Negative for: Received Chemotherapy; Received Radiation Immunizations Pneumococcal Vaccine: Received Pneumococcal Vaccination: No Implantable Devices None Family and Social History Cancer: Yes - Paternal Grandparents; Diabetes: Yes - Father,Siblings; Heart Disease: No; Hereditary Spherocytosis: No; Hypertension: No; Kidney Disease: No; Lung Disease: No; Seizures: No; Stroke: No; Thyroid Problems: No; Current every day smoker - 1 pack per week; Marital Status - Single; Alcohol Use: Rarely; Drug Use: Current History - Marijuana; Caffeine Use: Moderate - Soda Physician Affirmation I have reviewed and agree with the above information. Electronic Signature(s) Signed: 09/22/2019 6:42:58 PM By: Zenaida Deed RN, BSN Signed: 09/22/2019 6:48:11 PM By: Lenda Kelp PA-C Entered By: Lenda Kelp on 09/22/2019 18:32:35 -------------------------------------------------------------------------------- SuperBill Details Patient Name: Date of Service: Cheryl Mckinney, Cheryl Mckinney  09/22/2019 Medical Record Number:[id number] Patient Account Number: [id number] Date of Birth/Sex: Treating RN: 21-Oct-1966 (53 y.o. Cheryl Mckinney Primary Care Provider: Cain Saupe Other Clinician: Referring Provider: Treating Provider/Extender:Stone III, Havery Moros, Cammie Weeks in Treatment: 32 Diagnosis Coding ICD-10 Codes Code Description I89.0 Lymphedema, not elsewhere classified L97.829 Non-pressure chronic ulcer of other part of left lower leg with unspecified severity Facility Procedures CPT4 Code Description: [id number] 11042 - DEB SUBQ TISSUE 20 SQ CM/< ICD-10 Diagnosis Description L97.829 Non-pressure chronic ulcer of other part of left lower leg Modifier: with unspecifi Quantity: 1 ed severity Physician Procedures CPT4 Code Description: [id number] 11042 - WC PHYS SUBQ TISS 20 SQ CM ICD-10 Diagnosis Description L97.829 Non-pressure chronic ulcer of other part of left lower leg Modifier: with unspecifi Quantity: 1 ed severity Electronic Signature(s) Signed: 09/22/2019 6:48:11 PM By: Lenda Kelp PA-C Entered By: Lenda Kelp on 09/22/2019 18:37:17

## 2019-09-27 NOTE — Progress Notes (Signed)
Cheryl Mckinney, Cheryl Mckinney ([id number]) Visit Report for 09/22/2019 Arrival Information Details Patient Name: Date of Service: Cheryl Mckinney, Cheryl Mckinney 09/22/2019 10:00 AM Medical Record Number:[id number] Patient Account Number: [id number] Date of Birth/Sex: Treating RN: [birth year] (53 y.o. Wynelle LinkF) Lynch, Shatara Primary Care Cassandra Mcmanaman: Cain SaupeFulp, Cammie Other Clinician: Referring Marlina Cataldi: Treating Anaily Ashbaugh/Extender:Stone III, Havery MorosHoyt Fulp, Cammie Weeks in Treatment: 32 Visit Information History Since Last Visit Added or deleted any medications: No Patient Arrived: Ambulatory Any new allergies or adverse reactions: No Arrival Time: 12:05 Had a fall or experienced change in No Accompanied By: alone activities of daily living that may affect Transfer Assistance: None risk of falls: Patient Identification Verified: Yes Signs or symptoms of abuse/neglect since last No Secondary Verification Process Yes visito Completed: Hospitalized since last visit: No Patient Requires Transmission-Based No Implantable device outside of the clinic excluding No Precautions: cellular tissue based products placed in the center Patient Has Alerts: Yes since last visit: Patient Alerts: L ABI 1.18 Has Dressing in Place as Prescribed: No R ABI 1.18 Has Compression in Place as Prescribed: No Pain Present Now: No Electronic Signature(s) Signed: 09/27/2019 5:46:53 PM By: Zandra AbtsLynch, Shatara RN, BSN Entered By: Zandra AbtsLynch, Shatara on 09/22/2019 12:05:41 -------------------------------------------------------------------------------- Compression Therapy Details Patient Name: Date of Service: Cheryl Mckinney, Cheryl Mckinney 09/22/2019 10:00 AM Medical Record Number:[id number] Patient Account Number: [id number] Date of Birth/Sex: Treating RN: [birth year] (53 y.o. Tommye StandardF) Boehlein, Linda Primary Care Delainee Tramel: Cain SaupeFulp, Cammie Other Clinician: Referring Lyndsie Wallman: Treating Cambelle Suchecki/Extender:Stone III, Havery MorosHoyt Fulp, Cammie Weeks in Treatment: 32 Compression Therapy Performed for  Wound Wound #1 Left,Lateral Lower Leg Assessment: Performed By: Clinician Zandra AbtsLynch, Shatara, RN Compression Type: Four Layer Post Procedure Diagnosis Same as Pre-procedure Electronic Signature(s) Signed: 09/22/2019 6:42:58 PM By: Zenaida DeedBoehlein, Linda RN, BSN Entered By: Zenaida DeedBoehlein, Linda on 09/22/2019 12:56:23 -------------------------------------------------------------------------------- Encounter Discharge Information Details Patient Name: Date of Service: Cheryl Mckinney, Cheryl Mckinney 09/22/2019 10:00 AM Medical Record Number:[id number] Patient Account Number: [id number] Date of Birth/Sex: Treating RN: [birth year] (53 y.o. Wynelle LinkF) Lynch, Shatara Primary Care Aimi Essner: Cain SaupeFulp, Cammie Other Clinician: Referring Malcolm Quast: Treating Embree Brawley/Extender:Stone III, Havery MorosHoyt Fulp, Cammie Weeks in Treatment: 32 Encounter Discharge Information Items Post Procedure Vitals Discharge Condition: Stable Temperature (F): 98.7 Ambulatory Status: Ambulatory Pulse (bpm): 65 Discharge Destination: Home Respiratory Rate (breaths/min): 16 Transportation: Private Auto Blood Pressure (mmHg): 140/69 Accompanied By: alone Schedule Follow-up Appointment: Yes Clinical Summary of Care: Patient Declined Electronic Signature(s) Signed: 09/27/2019 5:46:53 PM By: Zandra AbtsLynch, Shatara RN, BSN Entered By: Zandra AbtsLynch, Shatara on 09/22/2019 17:58:59 -------------------------------------------------------------------------------- Lower Extremity Assessment Details Patient Name: Date of Service: Cheryl Mckinney, Cheryl Mckinney 09/22/2019 10:00 AM Medical Record Number:[id number] Patient Account Number: [id number] Date of Birth/Sex: Treating RN: [birth year] (53 y.o. Wynelle LinkF) Lynch, Shatara Primary Care Kaydi Kley: Cain SaupeFulp, Cammie Other Clinician: Referring Kaylor Simenson: Treating Kaien Pezzullo/Extender:Stone III, Havery MorosHoyt Fulp, Cammie Weeks in Treatment: 32 Edema Assessment Assessed: [Left: No] [Right: No] Edema: [Left: Ye] [Right: s] Calf Left: Right: Point of Measurement: 37 cm From Medial Instep  36.4 cm cm Ankle Left: Right: Point of Measurement: 10 cm From Medial Instep 23.2 cm cm Vascular Assessment Pulses: Dorsalis Pedis Palpable: [Left:Yes] Electronic Signature(s) Signed: 09/27/2019 5:46:53 PM By: Zandra AbtsLynch, Shatara RN, BSN Entered By: Zandra AbtsLynch, Shatara on 09/22/2019 12:09:38 -------------------------------------------------------------------------------- Multi-Disciplinary Care Plan Details Patient Name: Date of Service: Cheryl Mckinney, Cheryl Mckinney 09/22/2019 10:00 AM Medical Record Number:[id number] Patient Account Number: [id number] Date of Birth/Sex: Treating RN: [birth year] (53 y.o. Tommye StandardF) Boehlein, Linda Primary Care Nickey Canedo: Cain SaupeFulp, Cammie Other Clinician: Referring Pascuala Klutts: Treating Alanta Scobey/Extender:Stone III, Havery MorosHoyt Fulp, Cammie Weeks in Treatment: 32 Active Inactive Venous Leg Ulcer Nursing Diagnoses: Knowledge deficit related to disease process and management Potential  for venous Insuffiency (use before diagnosis confirmed) Goals: Patient will maintain optimal edema control Date Initiated: 02/10/2019 Target Resolution Date: 10/20/2019 Goal Status: Active Interventions: Assess peripheral edema status every visit. Compression as ordered Treatment Activities: Therapeutic compression applied : 02/10/2019 Notes: Electronic Signature(s) Signed: 09/22/2019 6:42:58 PM By: Baruch Gouty RN, BSN Entered By: Baruch Gouty on 09/22/2019 12:55:31 -------------------------------------------------------------------------------- Pain Assessment Details Patient Name: Date of Service: Cheryl Mckinney, Cheryl Mckinney 09/22/2019 10:00 AM Medical Record [id number] Patient Account Number: [id number] Date of Birth/Sex: Treating RN: 1965-12-24 (53 y.o. Nancy Fetter Primary Care Alexsandra Shontz: Antony Blackbird Other Clinician: Referring Gable Odonohue: Treating Cherlyn Syring/Extender:Stone III, Jillyn Hidden, Cammie Weeks in Treatment: 32 Active Problems Location of Pain Severity and Description of Pain Patient Has Paino  No Site Locations Pain Management and Medication Current Pain Management: Electronic Signature(s) Signed: 09/27/2019 5:46:53 PM By: Levan Hurst RN, BSN Entered By: Levan Hurst on 09/22/2019 12:05:49 -------------------------------------------------------------------------------- Patient/Caregiver Education Details Patient Name: Date of Service: Cheryl Mckinney 11/4/2020andnbsp10:00 AM Medical Record Patient Account Number: [id number] Number: Treating RN: Baruch Gouty Date of Birth/Gender: Oct 26, 1966 (53 y.o. Other Clinician: F) Treating Worthy Keeler Primary Care Physician: Antony Blackbird Physician/Extender: Referring Physician: Donalynn Furlong in Treatment: 66 Education Assessment Education Provided To: Patient Education Topics Provided Venous: Methods: Explain/Verbal Responses: Reinforcements needed, State content correctly Electronic Signature(s) Signed: 09/22/2019 6:42:58 PM By: Baruch Gouty RN, BSN Entered By: Baruch Gouty on 09/22/2019 12:56:03 -------------------------------------------------------------------------------- Wound Assessment Details Patient Name: Date of Service: Cheryl Mckinney, Cheryl Mckinney 09/22/2019 10:00 AM Medical Record [id number] Patient Account Number: [id number] Date of Birth/Sex: Treating RN: 21-Jul-1966 (53 y.o. Nancy Fetter Primary Care Carlesha Seiple: Antony Blackbird Other Clinician: Referring Jazion Atteberry: Treating Jashon Ishida/Extender:Stone III, Jillyn Hidden, Cammie Weeks in Treatment: 32 Wound Status Wound Number: 1 Primary Etiology: Venous Leg Ulcer Wound Location: Left Lower Leg - Lateral Wound Status: Open Wounding Event: Blister Comorbid History: Deep Vein Thrombosis, Hypertension Date Acquired: 02/16/2018 Weeks Of Treatment: 32 Clustered Wound: Yes Photos Wound Measurements Length: (cm) 2.5 % Reduction Width: (cm) 1.3 % Reduction Depth: (cm) 0.1 Epitheliali Clustered Quantity: 0 Tunneling: Area: (cm) 2.553  Underminin Volume: (cm) 0.255 Wound Description Full Thickness Without Exposed Support Foul Od Classification: Structures Slough/ Wound Thickened Margin: Exudate Medium Amount: Exudate Serosanguineous Type: Exudate red, brown Color: Wound Bed Granulation Amount: Large (67-100%) Granulation Quality: Red Fascia Necrotic Amount: Small (1-33%) Fat Lay Necrotic Quality: Adherent Slough Tendon Muscle Joint E Bone Ex or After Cleansing: No Fibrino Yes Exposed Structure Exposed: No er (Subcutaneous Tissue) Exposed: Yes Exposed: No Exposed: No xposed: No posed: No in Area: 97.3% in Volume: 97.3% zation: Small (1-33%) No g: No Treatment Notes Wound #1 (Left, Lateral Lower Leg) 1. Cleanse With Wound Cleanser 2. Periwound Care Moisturizing lotion TCA Cream 3. Primary Dressing Applied Polymem Ag 4. Secondary Dressing Dry Gauze 6. Support Layer Applied 4 layer compression wrap Notes netting. Electronic Signature(s) Signed: 09/27/2019 4:06:15 PM By: Mikeal Hawthorne EMT/HBOT Signed: 09/27/2019 5:46:53 PM By: Levan Hurst RN, BSN Entered By: Mikeal Hawthorne on 09/27/2019 11:59:04 -------------------------------------------------------------------------------- [place] Details Patient Name: Date of Service: Cheryl Mckinney, Cheryl Mckinney 09/22/2019 10:00 AM Medical Record [id number] Patient Account Number: [id number] Date of Birth/Sex: Treating RN: June 24, 1966 (53 y.o. Nancy Fetter Primary Care Dallon Dacosta: Antony Blackbird Other Clinician: Referring Kerrilyn Azbill: Treating Mckynzi Cammon/Extender:Stone III, Jillyn Hidden, Cammie Weeks in Treatment: 32 Vital Signs Time Taken: 12:06 Temperature (F): 98.7 Height (in): 71 Pulse (bpm): 65 Weight (lbs): 225 Respiratory Rate (breaths/min): 16 Body Mass Index (BMI): 31.4 Blood Pressure (mmHg): 140/69 Reference Range: 80 - 120 mg /  dl Electronic Signature(s) Signed: 09/27/2019 5:46:53 PM By: Zandra Abts RN, BSN Entered By: Zandra Abts on  09/22/2019 12:07:06

## 2019-09-29 ENCOUNTER — Ambulatory Visit (HOSPITAL_BASED_OUTPATIENT_CLINIC_OR_DEPARTMENT_OTHER): Payer: Medicaid Other | Admitting: Physician Assistant

## 2019-10-26 NOTE — Progress Notes (Signed)
SIERRA, SPARGO ([id number]) Visit Report for 08/11/2019 Arrival Information Details Patient Name: Date of Service: Mckinney, Cheryl 08/11/2019 12:30 PM Medical Record [id number] Patient Account Number: [id number] Date of Birth/Sex: Treating RN: 1966/02/06 (53 y.o. Elam Dutch Primary Care Marshawn Normoyle: Antony Blackbird Other Clinician: Referring Aki Abalos: Treating Jahmir Salo/Extender:Stone III, Jillyn Hidden, Cammie Weeks in Treatment: 26 Visit Information History Since Last Visit Added or deleted any medications: No Patient Arrived: Ambulatory Any new allergies or adverse reactions: No Arrival Time: 12:48 Had a fall or experienced change in No Accompanied By: self activities of daily living that may affect Transfer Assistance: None risk of falls: Patient Identification Verified: Yes Signs or symptoms of abuse/neglect since last No Secondary Verification Process Yes visito Completed: Hospitalized since last visit: No Patient Requires Transmission-Based No Implantable device outside of the clinic excluding No Precautions: cellular tissue based products placed in the center Patient Has Alerts: Yes since last visit: Patient Alerts: L ABI 1.18 Has Dressing in Place as Prescribed: Yes R ABI 1.18 Pain Present Now: No Electronic Signature(s) Signed: 10/26/2019 3:04:24 PM By: Sandre Kitty Entered By: Sandre Kitty on 08/11/2019 12:50:23 -------------------------------------------------------------------------------- Compression Therapy Details Patient Name: Date of Service: Cheryl, Mckinney 08/11/2019 12:30 PM Medical Record [id number] Patient Account Number: [id number] Date of Birth/Sex: Treating RN: [birth year] (53 y.o. Elam Dutch Primary Care Janith Nielson: Antony Blackbird Other Clinician: Referring Nishawn Rotan: Treating Krystalynn Ridgeway/Extender:Stone III, Jillyn Hidden, Cammie Weeks in Treatment: 26 Compression Therapy Performed for Wound Wound #1 Left,Lateral Lower  Leg Assessment: Performed By: Clinician Deon Pilling, RN Compression Type: Four Layer Post Procedure Diagnosis Same as Pre-procedure Electronic Signature(s) Signed: 08/11/2019 5:29:01 PM By: Baruch Gouty RN, BSN Entered By: Baruch Gouty on 08/11/2019 13:19:59 -------------------------------------------------------------------------------- Encounter Discharge Information Details Patient Name: Date of Service: Cheryl, Mckinney 08/11/2019 12:30 PM Medical Record [id number] Patient Account Number: [id number] Date of Birth/Sex: Treating RN: Aug 30, 1966 (53 y.o. Debby Bud Primary Care Chryl Holten: Antony Blackbird Other Clinician: Referring Elener Custodio: Treating Mayer Vondrak/Extender:Stone III, Jillyn Hidden, Cammie Weeks in Treatment: 26 Encounter Discharge Information Items Discharge Condition: Stable Ambulatory Status: Ambulatory Discharge Destination: Home Transportation: Private Auto Accompanied By: self Schedule Follow-up Appointment: Yes Clinical Summary of Care: Electronic Signature(s) Signed: 08/11/2019 5:12:23 PM By: Deon Pilling Entered By: Deon Pilling on 08/11/2019 13:37:21 -------------------------------------------------------------------------------- Lower Extremity Assessment Details Patient Name: Date of Service: Cheryl, Mckinney 08/11/2019 12:30 PM Medical Record [id number] Patient Account Number: [id number] Date of Birth/Sex: Treating RN: 1966-09-24 (53 y.o. Nancy Fetter Primary Care Omeed Osuna: Antony Blackbird Other Clinician: Referring Sukhraj Esquivias: Treating Thos Matsumoto/Extender:Stone III, Jillyn Hidden, Cammie Weeks in Treatment: 26 Edema Assessment Assessed: [Left: No] [Right: No] Edema: [Left: Ye] [Right: s] Calf Left: Right: Point of Measurement: 37 cm From Medial Instep 36 cm cm Ankle Left: Right: Point of Measurement: 10 cm From Medial Instep 23.5 cm cm Vascular Assessment Pulses: Dorsalis Pedis Palpable: [Left:Yes] Electronic Signature(s) Signed:  08/16/2019 6:25:06 PM By: Levan Hurst RN, BSN Entered By: Levan Hurst on 08/11/2019 13:01:31 -------------------------------------------------------------------------------- [place] Details Patient Name: Date of Service: Cheryl, Mckinney 08/11/2019 12:30 PM Medical Record [id number] Patient Account Number: [id number] Date of Birth/Sex: Treating RN: 12/03/1965 (53 y.o. Elam Dutch Primary Care Alarik Radu: Antony Blackbird Other Clinician: Referring Justus Droke: Treating Ioannis Schuh/Extender:Stone III, Jillyn Hidden, Cammie Weeks in Treatment: 26 Active Inactive Venous Leg Ulcer Nursing Diagnoses: Knowledge deficit related to disease process and management Potential for venous Insuffiency (use before diagnosis confirmed) Goals: Patient will maintain optimal edema control Date Initiated: 02/10/2019 Target Resolution Date: 08/20/2019 Goal Status: Active Interventions: Assess peripheral edema status every  visit. Compression as ordered Treatment Activities: Therapeutic compression applied : 02/10/2019 Notes: Electronic Signature(s) Signed: 08/11/2019 5:29:01 PM By: Zenaida Deed RN, BSN Entered By: Zenaida Deed on 08/11/2019 13:16:40 -------------------------------------------------------------------------------- Pain Assessment Details Patient Name: Date of Service: Cheryl, Mckinney 08/11/2019 12:30 PM Medical Record Number:[id number] Patient Account Number: [id number] Date of Birth/Sex: Treating RN: 1965/12/27 (52 y.o. [name] Primary Care Gypsy Kellogg: Cain Saupe Other Clinician: Referring Angelic Schnelle: Treating Mrytle Bento/Extender:Stone III, Havery Moros, Cammie Weeks in Treatment: 26 Active Problems Location of Pain Severity and Description of Pain Patient Has Paino No Site Locations Pain Management and Medication Current Pain Management: Electronic Signature(s) Signed: 08/11/2019 5:29:01 PM By: Zenaida Deed RN, BSN Signed: 10/26/2019 3:04:24 PM  By: Karl Ito Entered By: Karl Ito on 08/11/2019 12:51:22 -------------------------------------------------------------------------------- Patient/Caregiver Education Details Patient Name: Date of Service: Cheryl, Mckinney 9/23/2020andnbsp12:30 PM Medical Record Patient Account Number: [id number] Number: Treating RN: Zenaida Deed Date of Birth/Gender: [birth year] (53 y.o. F) Other Clinician: Primary Care Physician: Jillyn Hidden, Cammie Treating Lenda Kelp Referring Physician: Physician/Extender: Meredith Pel in Treatment: 31 Education Assessment Education Provided To: Patient Education Topics Provided Venous: Methods: Explain/Verbal Responses: Reinforcements needed, State content correctly Electronic Signature(s) Signed: 08/11/2019 5:29:01 PM By: Zenaida Deed RN, BSN Entered By: Zenaida Deed on 08/11/2019 13:19:36 -------------------------------------------------------------------------------- Wound Assessment Details Patient Name: Date of Service: Cheryl, Mckinney 08/11/2019 12:30 PM Medical Record Number:[id number] Patient Account Number: [id number] Date of Birth/Sex: Treating RN: April 25, 1966 (53 y.o. [name] Primary Care Levin Dagostino: Cain Saupe Other Clinician: Referring Maki Sweetser: Treating Jashan Cotten/Extender:Stone III, Havery Moros, Cammie Weeks in Treatment: 26 Wound Status Wound Number: 1 Primary Etiology: Venous Leg Ulcer Wound Location: Left Lower Leg - Lateral Wound Status: Open Wounding Event: Blister Comorbid History: Deep Vein Thrombosis, Hypertension Date Acquired: 02/16/2018 Weeks Of Treatment: 26 Clustered Wound: Yes Photos Wound Measurements Length: (cm) 4 % Reduction Width: (cm) 3 % Reduction Depth: (cm) 0.1 Epitheliali Clustered Quantity: 2 Tunneling: Area: (cm) 9.425 Underminin Volume: (cm) 0.942 Wound Description Classification: Full Thickness Without Exposed Support Foul Od Structures Slough/ Wound Flat  and Intact Margin: Exudate Medium Amount: Exudate Serosanguineous Type: Exudate red, brown Color: Wound Bed Granulation Amount: Large (67-100%) Granulation Quality: Red Fascia Necrotic Amount: Small (1-33%) Fat Lay Necrotic Quality: Adherent Slough Tendon Muscle Joint E Bone Ex or After Cleansing: No Fibrino Yes Exposed Structure Exposed: No er (Subcutaneous Tissue) Exposed: Yes Exposed: No Exposed: No xposed: No posed: No in Area: 90% in Volume: 90% zation: Small (1-33%) No g: No Electronic Signature(s) Signed: 08/12/2019 6:12:41 PM By: Zenaida Deed RN, BSN Signed: 08/13/2019 8:42:43 AM By: Benjaman Kindler EMT/HBOT Previous Signature: 08/11/2019 5:29:01 PM Version By: Zenaida Deed RN, BSN Entered By: Benjaman Kindler on 08/12/2019 09:09:56 -------------------------------------------------------------------------------- Vitals Details Patient Name: Date of Service: Cheryl, Mckinney 08/11/2019 12:30 PM Medical Record Number:[id number] Patient Account Number: [id number] Date of Birth/Sex: Treating RN: 30-Apr-1966 (53 y.o. [name] Primary Care Rita Prom: Cain Saupe Other Clinician: Referring Karthika Glasper: Treating Juandavid Dallman/Extender:Stone III, Havery Moros, Cammie Weeks in Treatment: 26 Vital Signs Time Taken: 12:50 Temperature (F): 99.3 Height (in): 71 Pulse (bpm): 73 Weight (lbs): 225 Respiratory Rate (breaths/min): 16 Body Mass Index (BMI): 31.4 Blood Pressure (mmHg): 155/93 Reference Range: 80 - 120 mg / dl Electronic Signature(s) Signed: 10/26/2019 3:04:24 PM By: Karl Ito Entered By: Karl Ito on 08/11/2019 12:51:12

## 2019-10-26 NOTE — Progress Notes (Signed)
Cheryl Mckinney, COLLEY ([id number]) Visit Report for 08/02/2019 Arrival Information Details Patient Name: Date of Service: Cheryl Mckinney 08/02/2019 11:00 AM Medical Record [id number] Patient Account Number: [id number] Date of Birth/Sex: Treating RN: [birth year] (53 y.o. Helene Shoe, Meta.Reding Primary Care Sebastien Jackson: Antony Blackbird Other Clinician: Sandre Kitty Referring Bentley Fissel: Treating Marice Angelino/Extender:Robson, Ginette Otto, Cammie Weeks in Treatment: 25 Visit Information History Since Last Visit All ordered tests and consults were completed: No Patient Arrived: Ambulatory Added or deleted any medications: No Arrival Time: 10:59 Any new allergies or adverse reactions: No Accompanied By: self Had a fall or experienced change in No Transfer Assistance: None activities of daily living that may affect Patient Identification Verified: Yes risk of falls: Secondary Verification Process Completed: Yes Signs or symptoms of abuse/neglect since last No Patient Requires Transmission-Based No visito Precautions: Hospitalized since last visit: No Patient Has Alerts: Yes Implantable device outside of the clinic excluding No Patient Alerts: L ABI 1.18 cellular tissue based products placed in the center R ABI 1.18 since last visit: Has Dressing in Place as Prescribed: Yes Has Compression in Place as Prescribed: Yes Pain Present Now: No Electronic Signature(s) Signed: 10/26/2019 3:04:22 PM By: Carlene Coria RN Entered By: Carlene Coria on 08/02/2019 11:23:10 -------------------------------------------------------------------------------- Compression Therapy Details Patient Name: Date of Service: Cheryl Mckinney 08/02/2019 11:00 AM Medical Record [id number] Patient Account Number: [id number] Date of Birth/Sex: Treating RN: April 27, 1966 (53 y.o. Debby Bud Primary Care Bevin Das: Antony Blackbird Other Clinician: Sandre Kitty Referring Zofia Peckinpaugh: Treating Luismiguel Lamere/Extender:Robson,  Ginette Otto, Cammie Weeks in Treatment: 25 Compression Therapy Performed for Wound Wound #1 Left,Lateral Lower Leg Assessment: Performed By: Jake Church, RN Compression Type: Four Layer Pre Treatment ABI: 1.2 Post Procedure Diagnosis Same as Pre-procedure Electronic Signature(s) Signed: 08/02/2019 5:18:15 PM By: Deon Pilling Entered By: Deon Pilling on 08/02/2019 11:15:57 -------------------------------------------------------------------------------- Encounter Discharge Information Details Patient Name: Date of Service: Cheryl Mckinney 08/02/2019 11:00 AM Medical Record [id number] Patient Account Number: [id number] Date of Birth/Sex: Treating RN: 12-25-1965 (53 y.o. Orvan Falconer Primary Care Jayvien Rowlette: Antony Blackbird Other Clinician: Sandre Kitty Referring Gracelynne Benedict: Treating Teodoro Jeffreys/Extender:Robson, Ginette Otto, Cammie Weeks in Treatment: 25 Encounter Discharge Information Items Discharge Condition: Stable Ambulatory Status: Ambulatory Discharge Destination: Home Transportation: Private Auto Accompanied By: self Schedule Follow-up Appointment: No Clinical Summary of Care: Electronic Signature(s) Signed: 10/26/2019 3:04:22 PM By: Carlene Coria RN Entered By: Carlene Coria on 08/02/2019 11:32:23 -------------------------------------------------------------------------------- Lower Extremity Assessment Details Patient Name: Date of Service: Cheryl Mckinney 08/02/2019 11:00 AM Medical Record [id number] Patient Account Number: [id number] Date of Birth/Sex: Treating RN: 1966/10/11 (53 y.o. Debby Bud Primary Care Loyola Santino: Antony Blackbird Other Clinician: Sandre Kitty Referring Jaelee Laughter: Treating Jerimie Mancuso/Extender:Robson, Ginette Otto, Cammie Weeks in Treatment: 25 Edema Assessment Assessed: [Left: Yes] [Right: No] Edema: [Left: Ye] [Right: s] Calf Left: Right: Point of Measurement: 37 cm From Medial Instep 38 cm cm Ankle Left:  Right: Point of Measurement: 10 cm From Medial Instep 25 cm cm Vascular Assessment Pulses: Dorsalis Pedis Palpable: [Left:Yes] Electronic Signature(s) Signed: 08/02/2019 5:18:15 PM By: Deon Pilling Entered By: Deon Pilling on 08/02/2019 11:11:43 -------------------------------------------------------------------------------- Multi Wound Chart Details Patient Name: Date of Service: Cheryl Mckinney 08/02/2019 11:00 AM Medical Record [id number] Patient Account Number: [id number] Date of Birth/Sex: Treating RN: 03-10-1966 (53 y.o. Debby Bud Primary Care Kiosha Buchan: Antony Blackbird Other Clinician: Sandre Kitty Referring Trellis Guirguis: Treating Nylia Gavina/Extender:Robson, Ginette Otto, Cammie Weeks in Treatment: 25 Vital Signs Height(in): 71 Pulse(bpm): 70 Weight(lbs): 225 Blood Pressure(mmHg): 154/84 Body Mass Index(BMI): 31 Temperature(F): 97.9 Respiratory 16 Rate(breaths/min): Photos: [1:No Photos] [N/A:N/A]  Wound Location: [1:Left Lower Leg - Lateral] [N/A:N/A] Wounding Event: [1:Blister] [N/A:N/A] Primary Etiology: [1:Venous Leg Ulcer] [N/A:N/A] Comorbid History: [1:Deep Vein Thrombosis, Hypertension] [N/A:N/A] Date Acquired: [1:02/16/2018] [N/A:N/A] Weeks of Treatment: [1:25] [N/A:N/A] Wound Status: [1:Open] [N/A:N/A] Clustered Wound: [1:Yes] [N/A:N/A] Clustered Quantity: [1:2] [N/A:N/A] Measurements L x W x D 3.5x3.1x0.1 [N/A:N/A] (cm) Area (cm) : [1:8.522] [N/A:N/A] Volume (cm) : [1:0.852] [N/A:N/A] % Reduction in Area: [1:91.00%] [N/A:N/A] % Reduction in Volume: [1:91.00%] [N/A:N/A] Classification: [1:Full Thickness Without Exposed Support Structures] [N/A:N/A] Exudate Amount: [1:Medium] [N/A:N/A] Exudate Type: [1:Serosanguineous] [N/A:N/A] Exudate Color: [1:red, brown] [N/A:N/A] Wound Margin: [1:Flat and Intact] [N/A:N/A] Granulation Amount: [1:Large (67-100%)] [N/A:N/A] Granulation Quality: [1:Red] [N/A:N/A] Necrotic Amount: [1:Small (1-33%)]  [N/A:N/A] Exposed Structures: [1:Fat Layer (Subcutaneous N/A Tissue) Exposed: Yes Fascia: No Tendon: No Muscle: No Joint: No Bone: No] Epithelialization: [1:Small (1-33%) Compression Therapy] [N/A:N/A N/A] Treatment Notes Electronic Signature(s) Signed: 08/02/2019 5:18:15 PM By: Shawn Stalleaton, Bobbi Signed: 08/03/2019 9:27:38 AM By: Baltazar Najjarobson, Michael MD Entered By: Baltazar Najjarobson, Michael on 08/02/2019 11:21:29 -------------------------------------------------------------------------------- Multi-Disciplinary Care Plan Details Patient Name: Date of Service: Beryle BeamsSwann, Judeth 08/02/2019 11:00 AM Medical Record Number:[id number] Patient Account Number: [id number] Date of Birth/Sex: Treating RN: 05-05-1966 (53 y.o. Arta SilenceF) Deaton, Bobbi Primary Care Zuri Lascala: Cain SaupeFulp, Cammie Other Clinician: Karl Itoawkins, Destiny Referring Damika Harmon: Treating Brenda Samano/Extender:Robson, Jiles HaroldMichael Fulp, Cammie Weeks in Treatment: 25 Active Inactive Venous Leg Ulcer Nursing Diagnoses: Knowledge deficit related to disease process and management Potential for venous Insuffiency (use before diagnosis confirmed) Goals: Patient will maintain optimal edema control Date Initiated: 02/10/2019 Target Resolution Date: 08/20/2019 Goal Status: Active Interventions: Assess peripheral edema status every visit. Compression as ordered Treatment Activities: Therapeutic compression applied : 02/10/2019 Notes: Electronic Signature(s) Signed: 08/02/2019 5:18:15 PM By: Shawn Stalleaton, Bobbi Entered By: Shawn Stalleaton, Bobbi on 08/02/2019 11:12:35 -------------------------------------------------------------------------------- Pain Assessment Details Patient Name: Date of Service: Beryle BeamsSwann, Meeya 08/02/2019 11:00 AM Medical Record Number:[id number] Patient Account Number: [id number] Date of Birth/Sex: Treating RN: 05-05-1966 (53 y.o. Arta SilenceF) Deaton, Bobbi Primary Care Oziel Beitler: Cain SaupeFulp, Cammie Other Clinician: Karl Itoawkins, Destiny Referring Loan Oguin: Treating Sarin Comunale/Extender:Robson,  Jiles HaroldMichael Fulp, Cammie Weeks in Treatment: 25 Active Problems Location of Pain Severity and Description of Pain Patient Has Paino No Site Locations Pain Management and Medication Current Pain Management: Electronic Signature(s) Signed: 08/02/2019 5:18:15 PM By: Shawn Stalleaton, Bobbi Signed: 10/26/2019 3:04:24 PM By: Karl Itoawkins, Destiny Entered By: Karl Itoawkins, Destiny on 08/02/2019 11:04:49 -------------------------------------------------------------------------------- Patient/Caregiver Education Details Patient Name: Date of Service: Beryle BeamsSwann, Aiyannah 9/14/2020andnbsp11:00 AM Medical Record Number:[id number] Patient Account Number: [id number] Date of Birth/Gender: 05-05-1966 (53 y.o. F) Treating RN: Shawn Stalleaton, Bobbi Primary Care Physician: Cain SaupeFulp, Cammie Other Clinician: Karl Itoawkins, Destiny Referring Physician: Treating Physician/Extender:Robson, Jiles HaroldMichael Fulp, Cammie Weeks in Treatment: 25 Education Assessment Education Provided To: Patient Education Topics Provided Wound/Skin Impairment: Handouts: Caring for Your Ulcer Methods: Explain/Verbal Responses: Reinforcements needed Electronic Signature(s) Signed: 08/02/2019 5:18:15 PM By: Shawn Stalleaton, Bobbi Entered By: Shawn Stalleaton, Bobbi on 08/02/2019 11:12:46 -------------------------------------------------------------------------------- Wound Assessment Details Patient Name: Date of Service: Beryle BeamsSwann, Trixie 08/02/2019 11:00 AM Medical Record Number:[id number] Patient Account Number: [id number] Date of Birth/Sex: Treating RN: 05-05-1966 (53 y.o. Arta SilenceF) Deaton, Bobbi Primary Care Calianna Kim: Cain SaupeFulp, Cammie Other Clinician: Karl Itoawkins, Destiny Referring Higinio Grow: Treating Rahaf Carbonell/Extender:Robson, Jiles HaroldMichael Fulp, Cammie Weeks in Treatment: 25 Wound Status Wound Number: 1 Primary Etiology: Venous Leg Ulcer Wound Location: Left Lower Leg - Lateral Wound Status: Open Wounding Event: Blister Comorbid History: Deep Vein Thrombosis, Hypertension Date Acquired: 02/16/2018 Weeks Of  Treatment: 25 Clustered Wound: Yes Photos Wound Measurements Length: (cm) 3.5 % Reduct Width: (cm) 3.1 % Reduct Depth: (cm) 0.1 Epitheli Clustered Quantity: 2 Tunnelin Area: (cm)  8.522 Undermi Volume: (cm) 0.852 Wound Description Classification: Full Thickness Without Exposed Support Foul Od Structures Slough/ Wound Flat and Intact Margin: Exudate Medium Amount: Exudate Serosanguineous Type: Exudate red, brown Color: Wound Bed Granulation Amount: Large (67-100%) Granulation Quality: Red Fascia Necrotic Amount: Small (1-33%) Fat Lay Necrotic Quality: Adherent Slough Tendon Muscle Joint E Bone Ex or After Cleansing: No Fibrino Yes Exposed Structure Exposed: No er (Subcutaneous Tissue) Exposed: Yes Exposed: No Exposed: No xposed: No posed: No ion in Area: 91% ion in Volume: 91% alization: Small (1-33%) g: No ning: No Electronic Signature(s) Signed: 08/03/2019 4:42:49 PM By: Benjaman Kindler EMT/HBOT Signed: 08/03/2019 6:35:32 PM By: Shawn Stall Previous Signature: 08/02/2019 5:18:15 PM Version By: Shawn Stall Entered By: Benjaman Kindler on 08/03/2019 10:52:18 -------------------------------------------------------------------------------- Vitals Details Patient Name: Date of Service: Mieke, Brinley 08/02/2019 11:00 AM Medical Record Number:[id number] Patient Account Number: [id number] Date of Birth/Sex: Treating RN: 06-08-1966 (53 y.o. Debara Pickett, Yvonne Kendall Primary Care Nickolai Rinks: Cain Saupe Other Clinician: Karl Ito Referring Jaecob Lowden: Treating Zira Helinski/Extender:Robson, Jiles Harold, Cammie Weeks in Treatment: 25 Vital Signs Time Taken: 11:04 Temperature (F): 97.9 Height (in): 71 Pulse (bpm): 70 Weight (lbs): 225 Respiratory Rate (breaths/min): 16 Body Mass Index (BMI): 31.4 Blood Pressure (mmHg): 154/84 Reference Range: 80 - 120 mg / dl Electronic Signature(s) Signed: 10/26/2019 3:04:24 PM By: Karl Ito Entered By: Karl Ito on  08/02/2019 11:04:43

## 2019-10-26 NOTE — Progress Notes (Signed)
Cheryl Mckinney, Cheryl Mckinney ([id number]) Visit Report for 09/08/2019 Arrival Information Details Patient Name: Date of Service: Cheryl Mckinney, Cheryl Mckinney 09/08/2019 10:30 AM Medical Record Number:[id number] Patient Account Number: [id number] Date of Birth/Sex: Treating RN: 1966-01-23 (53 y.o. [name] Primary Care Dajon Rowe: Cain Saupe Other Clinician: Referring Korynn Kenedy: Treating Sheilah Rayos/Extender:Stone III, Havery Moros, Cammie Weeks in Treatment: 30 Visit Information History Since Last Visit Added or deleted any medications: No Patient Arrived: Ambulatory Any new allergies or adverse reactions: No Arrival Time: 11:31 Had a fall or experienced change in No Accompanied By: self activities of daily living that may affect Transfer Assistance: None risk of falls: Patient Identification Verified: Yes Signs or symptoms of abuse/neglect since last No Secondary Verification Process Yes visito Completed: Hospitalized since last visit: No Patient Requires Transmission-Based No Implantable device outside of the clinic excluding No Precautions: cellular tissue based products placed in the center Patient Has Alerts: Yes since last visit: Patient Alerts: L ABI 1.18 Has Dressing in Place as Prescribed: Yes R ABI 1.18 Pain Present Now: No Electronic Signature(s) Signed: 10/26/2019 3:02:15 PM By: Karl Ito Entered By: Karl Ito on 09/08/2019 11:31:44 -------------------------------------------------------------------------------- Compression Therapy Details Patient Name: Date of Service: Cheryl Mckinney, Cheryl Mckinney 09/08/2019 10:30 AM Medical Record Number:[id number] Patient Account Number: [id number] Date of Birth/Sex: Treating RN: 1966/06/12 (53 y.o. [name] Primary Care Elvi Leventhal: Cain Saupe Other Clinician: Referring Faun Mcqueen: Treating Jovanny Stephanie/Extender:Stone III, Havery Moros, Cammie Weeks in Treatment: 30 Compression Therapy Performed for Wound Wound #1 Left,Lateral Lower  Leg Assessment: Performed By: Clinician Zandra Abts, RN Compression Type: Four Layer Post Procedure Diagnosis Same as Pre-procedure Electronic Signature(s) Signed: 09/08/2019 6:50:57 PM By: Zandra Abts RN, BSN Entered By: Zandra Abts on 09/08/2019 11:59:10 -------------------------------------------------------------------------------- Lower Extremity Assessment Details Patient Name: Date of Service: Cheryl Mckinney, Cheryl Mckinney 09/08/2019 10:30 AM Medical Record Number:[id number] Patient Account Number: [id number] Date of Birth/Sex: Treating RN: 1966/01/07 (53 y.o. Arta Silence Primary Care Farris Geiman: Cain Saupe Other Clinician: Referring Ihsan Nomura: Treating Laquiesha Piacente/Extender:Stone III, Havery Moros, Cammie Weeks in Treatment: 30 Edema Assessment Assessed: [Left: Yes] [Right: No] Edema: [Left: Ye] [Right: s] Calf Left: Right: Point of Measurement: 37 cm From Medial Instep 38.5 cm cm Ankle Left: Right: Point of Measurement: 10 cm From Medial Instep 26 cm cm Vascular Assessment Pulses: Dorsalis Pedis Palpable: [Left:Yes] Electronic Signature(s) Signed: 09/08/2019 6:26:11 PM By: Shawn Stall Entered By: Shawn Stall on 09/08/2019 11:39:56 -------------------------------------------------------------------------------- Multi-Disciplinary Care Plan Details Patient Name: Date of Service: Cheryl Mckinney, Cheryl Mckinney 09/08/2019 10:30 AM Medical Record Number:[id number] Patient Account Number: [id number] Date of Birth/Sex: Treating RN: 1966-01-18 (53 y.o. [name] Primary Care Shalayna Ornstein: Cain Saupe Other Clinician: Referring Jayani Rozman: Treating Charlissa Petros/Extender:Stone III, Havery Moros, Cammie Weeks in Treatment: 30 Active Inactive Venous Leg Ulcer Nursing Diagnoses: Knowledge deficit related to disease process and management Potential for venous Insuffiency (use before diagnosis confirmed) Goals: Patient will maintain optimal edema control Date Initiated: 02/10/2019 Target  Resolution Date: 09/22/2019 Goal Status: Active Interventions: Assess peripheral edema status every visit. Compression as ordered Treatment Activities: Therapeutic compression applied : 02/10/2019 Notes: Electronic Signature(s) Signed: 09/08/2019 6:50:57 PM By: Zandra Abts RN, BSN Entered By: Zandra Abts on 09/08/2019 13:36:39 -------------------------------------------------------------------------------- Pain Assessment Details Patient Name: Date of Service: Cheryl Mckinney, Cheryl Mckinney 09/08/2019 10:30 AM Medical Record Number:[id number] Patient Account Number: [id number] Date of Birth/Sex: Treating RN: 06-Jun-1966 (53 y.o. [name] Primary Care Prescott Truex: Cain Saupe Other Clinician: Referring Natlie Asfour: Treating Kalinda Romaniello/Extender:Stone III, Havery Moros, Cammie Weeks in Treatment: 30 Active Problems Location of Pain Severity and Description of Pain Patient Has Paino No Site Locations  Pain Management and Medication Current Pain Management: Electronic Signature(s) Signed: 09/08/2019 6:50:57 PM By: Levan Hurst RN, BSN Signed: 10/26/2019 3:02:15 PM By: Sandre Kitty Entered By: Sandre Kitty on 09/08/2019 11:33:21 -------------------------------------------------------------------------------- Patient/Caregiver Education Details Patient Name: Cheryl Mckinney 10/21/2020andnbsp10:30 Date of Service: AM Medical Record [id number] Number: Patient Account Number: [id number] Treating RN: Date of Birth/Gender: [birth year] (53 y.o. Nancy Fetter) Other Clinician: Primary Care Physician:Fulp, Cammie Treating Worthy Keeler Referring Physician: Physician/Extender: Donalynn Furlong in Treatment: 30 Education Assessment Education Provided To: Patient Education Topics Provided Wound/Skin Impairment: Methods: Explain/Verbal Responses: State content correctly Electronic Signature(s) Signed: 09/08/2019 6:50:57 PM By: Levan Hurst RN, BSN Entered By: Levan Hurst on  09/08/2019 13:36:49 -------------------------------------------------------------------------------- Wound Assessment Details Patient Name: Date of Service: Cheryl Mckinney, Cheryl Mckinney 09/08/2019 10:30 AM Medical Record [id number] Patient Account Number: [id number] Date of Birth/Sex: Treating RN: 1966-04-09 (53 y.o. Helene Shoe, Meta.Reding Primary Care Robbi Spells: Antony Blackbird Other Clinician: Referring Avenir Lozinski: Treating Gresham Caetano/Extender:Stone III, Jillyn Hidden, Cammie Weeks in Treatment: 30 Wound Status Wound Number: 1 Primary Etiology: Venous Leg Ulcer Wound Location: Left Lower Leg - Lateral Wound Status: Open Wounding Event: Blister Comorbid History: Deep Vein Thrombosis, Hypertension Date Acquired: 02/16/2018 Weeks Of Treatment: 30 Clustered Wound: Yes Photos Wound Measurements Length: (cm) 3.2 % Reduc Width: (cm) 2.1 % Reduc Depth: (cm) 0.3 Epithel Clustered Quantity: 1 Tunneli Area: (cm) 5.278 Underm Volume: (cm) 1.583 Wound Description Full Thickness Without Exposed Support Foul O Classification: Structures Slough Wound Thickened Margin: Exudate Medium Amount: Exudate Serosanguineous Type: Exudate red, brown Color: Wound Bed Granulation Amount: Large (67-100%) Granulation Quality: Red Fascia Necrotic Amount: None Present (0%) Fat Lay Tendon Muscle Joint E Bone Expos dor After Cleansing: No /Fibrino Yes Exposed Structure Exposed: No er (Subcutaneous Tissue) Exposed: Yes Exposed: No Exposed: No xposed: No ed: No tion in Area: 94.4% tion in Volume: 83.2% ialization: Small (1-33%) ng: No ining: No Electronic Signature(s) Signed: 09/09/2019 4:25:07 PM By: Mikeal Hawthorne EMT/HBOT Signed: 09/09/2019 6:35:09 PM By: Deon Pilling Previous Signature: 09/08/2019 6:26:11 PM Version By: Deon Pilling Entered By: Mikeal Hawthorne on 09/09/2019 13:57:33 -------------------------------------------------------------------------------- Vitals Details Patient Name: Date of  Service: Cheryl Mckinney, Cheryl Mckinney 09/08/2019 10:30 AM Medical Record [id number] Patient Account Number: [id number] Date of Birth/Sex: Treating RN: Dec 05, 1965 (53 y.o. Nancy Fetter Primary Care Aolanis Crispen: Antony Blackbird Other Clinician: Referring Meril Dray: Treating Juniper Snyders/Extender:Stone III, Jillyn Hidden, Cammie Weeks in Treatment: 30 Vital Signs Time Taken: 11:31 Temperature (F): 98.6 Height (in): 71 Pulse (bpm): 68 Weight (lbs): 225 Respiratory Rate (breaths/min): 16 Body Mass Index (BMI): 31.4 Blood Pressure (mmHg): 142/75 Reference Range: 80 - 120 mg / dl Electronic Signature(s) Signed: 10/26/2019 3:02:15 PM By: Sandre Kitty Entered By: Sandre Kitty on 09/08/2019 11:33:15

## 2019-10-26 NOTE — Progress Notes (Signed)
Cheryl Mckinney, Cheryl Mckinney ([id number]) Visit Report for 09/15/2019 Arrival Information Details Patient Name: Date of Service: Cheryl Mckinney 09/15/2019 10:15 AM Medical Record Number:[id number] Patient Account Number: [id number] Date of Birth/Sex: Treating RN: Mar 23, 1966 (53 y.o. F) Primary Care Danahi Reddish: Cain Saupe Other Clinician: Referring Yichen Gilardi: Treating Panayiota Larkin/Extender:Stone III, Havery Moros, Cammie Weeks in Treatment: 31 Visit Information History Since Last Visit Added or deleted any medications: No Patient Arrived: Ambulatory Any new allergies or adverse reactions: No Arrival Time: 11:28 Had a fall or experienced change in No Accompanied By: self activities of daily living that may affect Transfer Assistance: None risk of falls: Patient Identification Verified: Yes Signs or symptoms of abuse/neglect since last No Secondary Verification Process Yes visito Completed: Hospitalized since last visit: No Patient Requires Transmission-Based No Implantable device outside of the clinic excluding No Precautions: cellular tissue based products placed in the center Patient Has Alerts: Yes since last visit: Patient Alerts: L ABI 1.18 Has Dressing in Place as Prescribed: Yes R ABI 1.18 Pain Present Now: No Electronic Signature(s) Signed: 10/26/2019 3:02:15 PM By: Karl Ito Entered By: Karl Ito on 09/15/2019 11:28:58 -------------------------------------------------------------------------------- Compression Therapy Details Patient Name: Date of Service: Cheryl Mckinney 09/15/2019 10:15 AM Medical Record Number:[id number] Patient Account Number: [id number] Date of Birth/Sex: Treating RN: September 24, 1966 (53 y.o. [name] Primary Care Loreto Loescher: Cain Saupe Other Clinician: Referring Bhavika Schnider: Treating Suheyb Raucci/Extender:Stone III, Havery Moros, Cammie Weeks in Treatment: 31 Compression Therapy Performed for Wound Wound #1 Left,Lateral Lower  Leg Assessment: Performed By: Clinician Zenaida Deed, RN Compression Type: Four Layer Electronic Signature(s) Signed: 09/15/2019 1:37:03 PM By: Zenaida Deed RN, BSN Entered By: Zenaida Deed on 09/15/2019 12:02:44 -------------------------------------------------------------------------------- Encounter Discharge Information Details Patient Name: Date of Service: Cheryl Mckinney 09/15/2019 10:15 AM Medical Record Number:[id number] Patient Account Number: [id number] Date of Birth/Sex: Treating RN: 1966-08-03 (53 y.o. [name] Primary Care Lamonda Noxon: Cain Saupe Other Clinician: Referring Piero Mustard: Treating Tej Murdaugh/Extender:Stone III, Havery Moros, Cammie Weeks in Treatment: 31 Encounter Discharge Information Items Discharge Condition: Stable Ambulatory Status: Ambulatory Discharge Destination: Home Transportation: Private Auto Accompanied By: self Schedule Follow-up Appointment: Yes Clinical Summary of Care: Patient Declined Electronic Signature(s) Signed: 09/15/2019 1:37:03 PM By: Zenaida Deed RN, BSN Entered By: Zenaida Deed on 09/15/2019 12:03:53 -------------------------------------------------------------------------------- Patient/Caregiver Education Details Patient Name: [name] 10/28/2020andnbsp10:15 Date of Service: AM Medical Record [id number] Number: Patient Account Number: [id number] Treating RN: Date of Birth/Gender: 01/13/1966 (53 y.o. [name]) Other Clinician: Primary Care Physician:Fulp, Cammie Treating Lenda Kelp Referring Physician: Physician/Extender: Meredith Pel in Treatment: 83 Education Assessment Education Provided To: Patient Education Topics Provided Venous: Methods: Explain/Verbal Responses: Reinforcements needed, State content correctly Electronic Signature(s) Signed: 09/15/2019 1:37:03 PM By: Zenaida Deed RN, BSN Entered By: Zenaida Deed on 09/15/2019  12:03:41 -------------------------------------------------------------------------------- Wound Assessment Details Patient Name: Date of Service: Cheryl Mckinney 09/15/2019 10:15 AM Medical Record Number:[id number] Patient Account Number: [id number] Date of Birth/Sex: Treating RN: 1965-12-23 (53 y.o. [name] Primary Care Veralyn Lopp: Cain Saupe Other Clinician: Referring Monterrius Cardosa: Treating Bharath Bernstein/Extender:Stone III, Havery Moros, Cammie Weeks in Treatment: 31 Wound Status Wound Number: 1 Primary Etiology: Venous Leg Ulcer Wound Location: Left Lower Leg - Lateral Wound Status: Open Wounding Event: Blister Comorbid History: Deep Vein Thrombosis, Hypertension Date Acquired: 02/16/2018 Weeks Of Treatment: 31 Clustered Wound: Yes Wound Measurements Length: (cm) 3.2 % Reduc Width: (cm) 2.1 % Reduc Depth: (cm) 0.3 Epithel Clustered Quantity: 1 Tunneli Area: (cm) 5.278 Underm Volume: (cm) 1.583 Wound Description Classification: Full Thickness Without Exposed Support Foul O Structures Slough Wound Thickened Margin:  Exudate Medium Amount: Exudate Serosanguineous Type: Exudate red, brown Color: Wound Bed Granulation Amount: Large (67-100%) Granulation Quality: Red Fascia Necrotic Amount: Small (1-33%) Fat Lay Necrotic Quality: Adherent Slough Tendon Muscle Joint E Bone Ex Electronic Signature(s) Signed: 09/15/2019 1:37:03 PM By: Baruch Gouty RN, BSN Entered By: Baruch Gouty on 10/28/ dor After Cleansing: No Lynita Lombard Yes Exposed Structure Exposed: No er (Subcutaneous Tissue) Exposed: Yes Exposed: No Exposed: No xposed: No posed: No 2020 12:02:14 tion in Area: 94.4% tion in Volume: 83.2% ialization: Small (1-33%) ng: No ining: No -------------------------------------------------------------------------------- Vitals Details Patient Name: Date of Service: Cheryl Mckinney 09/15/2019 10:15 AM Medical Record [id number] Patient Account  Number: [id number] Date of Birth/Sex: Treating RN: 12-06-1965 (53 y.o. F) Primary Care Khamarion Bjelland: Antony Blackbird Other Clinician: Referring Lenzie Montesano: Treating Rhilee Currin/Extender:Stone III, Jillyn Hidden, Cammie Weeks in Treatment: 31 Vital Signs Time Taken: 11:29 Temperature (F): 97.8 Height (in): 71 Pulse (bpm): 71 Weight (lbs): 225 Respiratory Rate (breaths/min): 16 Body Mass Index (BMI): 31.4 Blood Pressure (mmHg): 120/71 Reference Range: 80 - 120 mg / dl Electronic Signature(s) Signed: 10/26/2019 3:02:15 PM By: Sandre Kitty Entered By: Sandre Kitty on 09/15/2019 11:30:26

## 2023-01-20 ENCOUNTER — Other Ambulatory Visit: Payer: Self-pay

## 2023-01-20 ENCOUNTER — Encounter (HOSPITAL_COMMUNITY): Payer: Self-pay

## 2023-01-20 ENCOUNTER — Emergency Department (HOSPITAL_COMMUNITY)
Admission: EM | Admit: 2023-01-20 | Discharge: 2023-01-20 | Disposition: A | Discharge status: Home / Self Care | Payer: Medicaid Other | Attending: Emergency Medicine | Admitting: Emergency Medicine

## 2023-01-20 DIAGNOSIS — F17210 Nicotine dependence, cigarettes, uncomplicated: Secondary | ICD-10-CM | POA: Diagnosis not present

## 2023-01-20 DIAGNOSIS — I10 Essential (primary) hypertension: Secondary | ICD-10-CM | POA: Diagnosis not present

## 2023-01-20 DIAGNOSIS — Z79899 Other long term (current) drug therapy: Secondary | ICD-10-CM | POA: Diagnosis not present

## 2023-01-20 LAB — COMPREHENSIVE METABOLIC PANEL
ALT: 13 U/L (ref 0–44)
AST: 19 U/L (ref 15–41)
Albumin: 3.9 g/dL (ref 3.5–5.0)
Alkaline Phosphatase: 68 U/L (ref 38–126)
Anion gap: 7 (ref 5–15)
BUN: 20 mg/dL (ref 6–20)
CO2: 26 mmol/L (ref 22–32)
Calcium: 8.8 mg/dL — ABNORMAL LOW (ref 8.9–10.3)
Chloride: 104 mmol/L (ref 98–111)
Creatinine, Ser: 1.07 mg/dL — ABNORMAL HIGH (ref 0.44–1.00)
GFR, Estimated: >60 mL/min (ref >60)
Glucose, Bld: 122 mg/dL — ABNORMAL HIGH (ref 70–99)
Potassium: 3 mmol/L — ABNORMAL LOW (ref 3.5–5.1)
Sodium: 137 mmol/L (ref 135–145)
Total Bilirubin: 0.6 mg/dL (ref 0.3–1.2)
Total Protein: 7.6 g/dL (ref 6.5–8.1)

## 2023-01-20 LAB — CBC WITH DIFFERENTIAL/PLATELET
Abs Immature Granulocytes: 0.01 10*3/uL (ref 0.00–0.07)
Basophils Absolute: 0 10*3/uL (ref 0.0–0.1)
Basophils Relative: 1 %
Eosinophils Absolute: 0.2 10*3/uL (ref 0.0–0.5)
Eosinophils Relative: 3 %
HCT: 41.7 % (ref 36.0–46.0)
Hemoglobin: 13.2 g/dL (ref 12.0–15.0)
Immature Granulocytes: 0 %
Lymphocytes Relative: 45 %
Lymphs Abs: 2.9 10*3/uL (ref 0.7–4.0)
MCH: 27.9 pg (ref 26.0–34.0)
MCHC: 31.7 g/dL (ref 30.0–36.0)
MCV: 88.2 fL (ref 80.0–100.0)
Monocytes Absolute: 0.4 10*3/uL (ref 0.1–1.0)
Monocytes Relative: 7 %
Neutro Abs: 2.9 10*3/uL (ref 1.7–7.7)
Neutrophils Relative %: 44 %
Platelets: 163 10*3/uL (ref 150–400)
RBC: 4.73 MIL/uL (ref 3.87–5.11)
RDW: 14.1 % (ref 11.5–15.5)
WBC: 6.5 10*3/uL (ref 4.0–10.5)
nRBC: 0 % (ref 0.0–0.2)

## 2023-01-20 LAB — TROPONIN I (HIGH SENSITIVITY): Troponin I (High Sensitivity): 25 ng/L — ABNORMAL HIGH (ref <18)

## 2023-01-20 MED ORDER — AMLODIPINE BESYLATE 5 MG PO TABS: 5.0000 mg | ORAL_TABLET | Freq: Every day | ORAL | 2 refills | Status: AC

## 2023-01-20 MED ORDER — AMLODIPINE BESYLATE 5 MG PO TABS
5.0000 mg | ORAL_TABLET | Freq: Once | ORAL | Status: AC
  Administered 2023-01-20: 5 mg via ORAL
  Filled 2023-01-20: qty 1

## 2023-01-20 NOTE — ED Triage Notes (Addendum)
Patient went to her primary care doctor was told her blood pressure was elevated. It was over [id number] systolic at the doctor. Slight headache. No nausea vomiting, chest pain, dizziness or blurred vision. Quit taking blood pressure medication over a year ago.

## 2023-01-20 NOTE — ED Provider Triage Note (Signed)
Emergency Medicine Provider Triage Evaluation Note  Cheryl Mckinney , a 57 y.o. female  was evaluated in triage.  Pt complains of elevated blood pressure.  Pt sent here by primary care  Review of Systems  Positive: No chest pain  Negative: fever  Physical Exam  BP (!) 213/126 (BP Location: Left Arm)   Pulse 80   Temp 98.6 F (37 C) (Oral)   Resp 18   Ht '5\' 11"'$  (1.803 m)   Wt 103.4 kg   SpO2 100%   BMI 31.80 kg/m  Gen:   Awake, no distress   Resp:  Normal effort  MSK:   Moves extremities without difficulty  Other:    Medical Decision Making  Medically screening exam initiated at 3:14 PM.  Appropriate orders placed.  Cheryl Mckinney was informed that the remainder of the evaluation will be completed by another provider, this initial triage assessment does not replace that evaluation, and the importance of remaining in the ED until their evaluation is complete.     Fransico Meadow, [place] 01/20/23 1515

## 2023-01-20 NOTE — Discharge Instructions (Addendum)
Today your kidneys look normal, blood counts were normal, your potassium was slightly low just make sure you are eating foods with potassium in them.  Information was sent on that.  Otherwise follow low-salt diet.  The biggest thing is if you develop chest pain, shortness of breath, visual changes, severe headache, difficulty walking or one-sided numbness or weakness you should return to the emergency room immediately.

## 2023-01-20 NOTE — ED Provider Notes (Signed)
[place] EMERGENCY DEPARTMENT AT [facility] Provider Note   CSN: [id number] Arrival date & time: 01/20/23  1431     History  Chief Complaint  Patient presents with   Hypertension    Cheryl Mckinney is a 57 y.o. female.  Patient is a 29 female with a history of hypertension who is presenting today from her PCP office for elevated blood pressure.  Patient reports she last took blood pressure medicine 2 to 3 years ago.  She had been on 5 mg of amlodipine prior to that but then reports she got busy taking care of family members never followed up with her doctor or refill the prescription.  She has not checked her blood pressure in the last several years to know what it has been running without medication but finally got an appointment to see a PCP today and when she got there her blood pressure was very elevated.  They reports the protocols have changed and they were not allowed to treat her blood pressure in the office anymore and she needed to come to the emergency room.  Patient reports she has a very mild pressure in her head that she reports feels like she is going to start getting a headache but denies severe headache, visual changes, unilateral numbness weakness, difficulty walking, chest pain, shortness of breath, swelling.  She does smoke cigarettes regularly and occasionally marijuana.  She drinks alcohol on the weekends but denies any cocaine use.  She does not take any medications at this time including over-the-counter medications.  She has been trying to change her diet and eat lower salt foods.  The history is provided by the patient.  Hypertension       Home Medications Prior to Admission medications   Medication Sig Start Date End Date Taking? Authorizing Provider  amLODipine (NORVASC) 5 MG tablet Take 1 tablet (5 mg total) by mouth daily. 01/20/23  Yes Sanii Kukla, Loree Fee, MD  ibuprofen (ADVIL,MOTRIN) 200 MG tablet Take 600-800 mg by mouth every 4 (four) hours as  needed (pain).    [provider]  naproxen sodium (ALEVE) 220 MG tablet Take 440 mg by mouth 2 (two) times daily as needed (pain).    [provider]      Allergies    Lisinopril    Review of Systems   Review of Systems  Physical Exam Updated Vital Signs BP (!) 193/130   Pulse 68   Temp 98.6 F (37 C) (Oral)   Resp 18   Ht '5\' 11"'$  (1.803 m)   Wt 103.4 kg   SpO2 100%   BMI 31.80 kg/m  Physical Exam Vitals and nursing note reviewed.  Constitutional:      General: She is not in acute distress.    Appearance: She is well-developed.  HENT:     Head: Normocephalic and atraumatic.  Eyes:     Pupils: Pupils are equal, round, and reactive to light.  Cardiovascular:     Rate and Rhythm: Normal rate and regular rhythm.     Heart sounds: Normal heart sounds. No murmur heard.    No friction rub.  Pulmonary:     Effort: Pulmonary effort is normal.     Breath sounds: Normal breath sounds. No wheezing or rales.  Abdominal:     General: Bowel sounds are normal. There is no distension.     Palpations: Abdomen is soft.     Tenderness: There is no abdominal tenderness. There is no guarding or rebound.  Musculoskeletal:        General: No tenderness. Normal range of motion.     Comments: No edema  Skin:    General: Skin is warm and dry.     Findings: No rash.  Neurological:     Mental Status: She is alert and oriented to person, place, and time. Mental status is at baseline.     Cranial Nerves: No cranial nerve deficit.     Sensory: No sensory deficit.     Motor: No weakness.     Gait: Gait normal.  Psychiatric:        Behavior: Behavior normal.     ED Results / Procedures / Treatments   Labs (all labs ordered are listed, but only abnormal results are displayed) Labs Reviewed  COMPREHENSIVE METABOLIC PANEL - Abnormal; Notable for the following components:      Result Value   Potassium 3.0 (*)    Glucose, Bld 122 (*)    Creatinine, Ser 1.07 (*)     Calcium 8.8 (*)    All other components within normal limits  TROPONIN I (HIGH SENSITIVITY) - Abnormal; Notable for the following components:   Troponin I (High Sensitivity) 25 (*)    All other components within normal limits  CBC WITH DIFFERENTIAL/PLATELET  TROPONIN I (HIGH SENSITIVITY)    EKG EKG Interpretation  Date/Time:  Monday January 20 2023 15:20:07 EST Ventricular Rate:  73 PR Interval:  164 QRS Duration: 93 QT Interval:  398 QTC Calculation: 439 R Axis:   39 Text Interpretation: Sinus rhythm LVH with secondary repolarization abnormality new inferior T wave inversions Confirmed by Kommor, Madison ([id number]) on 01/20/2023 3:22:32 PM  Radiology No results found.  Procedures Procedures    Medications Ordered in ED Medications  amLODipine (NORVASC) tablet 5 mg (5 mg Oral Given 01/20/23 1525)    ED Course/ Medical Decision Making/ A&P                             Medical Decision Making Amount and/or Complexity of Data Reviewed Labs: ordered. Decision-making details documented in ED Course. ECG/medicine tests: ordered and independent interpretation performed. Decision-making details documented in ED Course.  Risk Prescription drug management.   Pt with multiple medical problems and comorbidities and presenting today with a complaint that caries a high risk for morbidity and mortality.  Here today was significant elevation of her blood pressure at 213/126 has been off blood pressure medication for years.  Patient has no focal neurologic findings on exam today.  She is in no acute distress and is well-appearing.  I independently interpreted patient's labs and EKG.  Patient's EKG does show findings consistent with LVH which are changed from her prior EKG in 2020 when she was taking medication.  No evidence to suggest ACS at this time and patient is not having any chest pain or shortness of breath.  Patient was started back on 5 mg of amlodipine and does have a PCP she can follow-up  with.  CBC within normal limits, BMP and troponin show a hypokalemia of 3.0 but stable creatinine of 1 as well as a troponin which is mildly elevated at 25 which is most likely related to chronic leak from significant hypertension.  Patient is not having any symptoms concerning for ACS today.  She is walking around the room without chest pain or shortness of breath.  Repeat blood pressure 193/130 but patient will need to be on medication for  2 weeks and then rechecked with her PCP.  She was given return precautions which she understands and at this time do not feel that patient needs repeat troponin testing.  Patient was given a prescription for blood pressure medication and she was discharged home in good condition.         Final Clinical Impression(s) / ED Diagnoses Final diagnoses:  Primary hypertension    Rx / DC Orders ED Discharge Orders          Ordered    amLODipine (NORVASC) 5 MG tablet  Daily        01/20/23 1635              Blanchie Dessert, MD 01/20/23 1656

## 2024-01-05 ENCOUNTER — Other Ambulatory Visit: Payer: Self-pay | Admitting: Family Medicine

## 2024-01-05 DIAGNOSIS — Z0000 Encounter for general adult medical examination without abnormal findings: Secondary | ICD-10-CM

## 2024-01-14 ENCOUNTER — Ambulatory Visit: Payer: Medicaid Other | Admitting: Neurology

## 2024-01-14 VITALS — BP 145/90 | HR 92 | Ht 71.0 in | Wt 214.0 lb

## 2024-01-14 DIAGNOSIS — R0681 Apnea, not elsewhere classified: Secondary | ICD-10-CM

## 2024-01-14 DIAGNOSIS — R058 Other specified cough: Secondary | ICD-10-CM

## 2024-01-14 DIAGNOSIS — R0683 Snoring: Secondary | ICD-10-CM | POA: Diagnosis not present

## 2024-01-14 DIAGNOSIS — R030 Elevated blood-pressure reading, without diagnosis of hypertension: Secondary | ICD-10-CM

## 2024-01-14 DIAGNOSIS — G4719 Other hypersomnia: Secondary | ICD-10-CM | POA: Diagnosis not present

## 2024-01-14 DIAGNOSIS — Z9189 Other specified personal risk factors, not elsewhere classified: Secondary | ICD-10-CM | POA: Diagnosis not present

## 2024-01-14 DIAGNOSIS — E663 Overweight: Secondary | ICD-10-CM

## 2024-01-14 DIAGNOSIS — R351 Nocturia: Secondary | ICD-10-CM

## 2024-01-14 DIAGNOSIS — I10 Essential (primary) hypertension: Secondary | ICD-10-CM

## 2024-01-14 NOTE — Progress Notes (Signed)
 Subjective:    Patient ID: Cheryl Mckinney is a 58 y.o. female.  HPI    Cheryl Foley, MD, PhD [facility] [address], [address] [address] [place], [place] [address]  [name],    I saw your patient, Cheryl Mckinney, upon your kind request in my sleep clinic today for initial consultation of her sleep disorder, in particular, concern for underlying obstructive sleep apnea.  The patient is unaccompanied today.  As you know, Cheryl Mckinney is a 58 year old female with an underlying medical history of hypertension, and hypertensive urgency, smoking, history of left leg ulcer, DVT, and overweight state, who reports snoring and excessive daytime somnolence sleep restorative sleep and difficulty with blood pressure management.  She wakes up coughing at night, she has been witnessed to have apneic pauses while asleep per sister.  She is not aware of any family history of sleep apnea.  Her Epworth sleepiness score is 10 out of 24, very score is 19 out of 63.  I reviewed your office note from 08/11/2024.  She has been on Wellbutrin for smoking cessation.  She is on Hyzaar for blood pressure management as well as amlodipine.  She was given clonidine in the office.   Bedtime is around 10 PM and rise time around 3 AM.  She has trouble maintaining sleep.  She does not take anything over-the-counter or by prescription for sleep.  She lives with her mom.  She has nocturia about 3-4 times per average night, denies recurrent nocturnal or morning headaches.  She drinks no daily caffeine.  She has not had any alcohol in 2 months, she is trying to quit smoking, smokes about a pack per day currently.  She has had weight gain in the realm of 25 in the past year.  They have no pets in the household.  She smokes marijuana occasionally, once or twice a month.    Her Past Medical History Is Significant For: Past Medical History:  Diagnosis Date   DVT (deep venous thrombosis) (HCC) 2016   Hypertension      Her Past Surgical History Is Significant For: Past Surgical History:  Procedure Laterality Date   CESAREAN SECTION     x5   TUBAL LIGATION      Her Family History Is Significant For: Family History  Problem Relation Age of Onset   Hypertension Mother    Diabetes Mother     Her Social History Is Significant For: Social History   Socioeconomic History   Marital status: Single    Spouse name: Not on file   Number of children: Not on file   Years of education: Not on file   Highest education level: Not on file  Occupational History   Not on file  Tobacco Use   Smoking status: Every Day    Current packs/day: 0.50    Types: Cigarettes   Smokeless tobacco: Never  Vaping Use   Vaping status: Never Used  Substance and Sexual Activity   Alcohol use: Not Currently    Comment: once-twice weeks.    Drug use: Yes    Types: Marijuana   Sexual activity: Not Currently  Other Topics Concern   Not on file  Social History Narrative   Not on file   Social Drivers of Health   Financial Resource Strain: Not at Risk (01/20/2023)   Received from [place], [employer]    Financial Resource Strain: 1  Food Insecurity: Not at Risk (01/20/2023)  Received from [place], [place]   Food Insecurity    Food: 1  Transportation Needs: Not at Risk (01/20/2023)   Received from [place], [employer] Needs    Transportation: 1  Physical Activity: Not on File (12/31/2022)   Received from [place], [place]   Physical Activity    Physical Activity: 0  Stress: Not on File (12/31/2022)   Received from [facility], [place]   Stress    Stress: 0  Social Connections: Not on File (07/29/2023)   Received from [facility]   Social Connections    Connectedness: 0    Her Allergies Are:  Allergies  Allergen Reactions   Lisinopril Swelling    Lip swelling   :   Her Current Medications Are:  Outpatient Encounter Medications as of 01/14/2024  Medication Sig   amLODipine (NORVASC) 5 MG tablet  Take 1 tablet (5 mg total) by mouth daily.   atorvastatin (LIPITOR) 40 MG tablet Take 40 mg by mouth daily.   buPROPion (WELLBUTRIN SR) 150 MG 12 hr tablet Take 150 mg by mouth 2 (two) times daily.   ibuprofen (ADVIL,MOTRIN) 200 MG tablet Take 600-800 mg by mouth every 4 (four) hours as needed (pain).   metFORMIN (GLUCOPHAGE) 500 MG tablet Take 500 mg by mouth 2 (two) times daily with a meal.   metroNIDAZOLE (FLAGYL) 500 MG tablet Take 500 mg by mouth 2 (two) times daily.   Vitamin D, Ergocalciferol, (DRISDOL) 1.25 MG (50000 UNIT) CAPS capsule Take 50,000 Units by mouth once a week.   losartan-hydrochlorothiazide (HYZAAR) 100-25 MG tablet Take 1 tablet by mouth daily. (Patient not taking: Reported on 01/14/2024)   naproxen sodium (ALEVE) 220 MG tablet Take 440 mg by mouth 2 (two) times daily as needed (pain). (Patient not taking: Reported on 01/14/2024)   No facility-administered encounter medications on file as of 01/14/2024.  :   Review of Systems:  Out of a complete 14 point review of systems, all are reviewed and negative with the exception of these symptoms as listed below:  Review of Systems  Neurological:        Patient in room #9 and alone. Patient states she here to discuss her snoring and daytime sleepiness. Patient states she wakes up tried and she wakes up in the nigth, hard to fall back to sleep. ESS-10  & FSS-19    Objective:  Neurological Exam  Physical Exam Physical Examination:   Vitals:   01/14/24 1338  BP: (!) 145/90  Pulse: 92    General Examination: The patient is a very pleasant 58 y.o. female in no acute distress. She appears well-developed and well-nourished and well groomed.   HEENT: Normocephalic, atraumatic, pupils are equal, round and reactive to light, extraocular tracking is good without limitation to gaze excursion or nystagmus noted. Hearing is grossly intact. Face is symmetric with normal facial animation. Speech is clear with no dysarthria noted.  There is no hypophonia. There is no lip, neck/head, jaw or voice tremor. Neck is supple with full range of passive and active motion. There are no carotid bruits on auscultation. Oropharynx exam reveals: mild mouth dryness, adequate dental hygiene and moderate airway crowding, due to small airway entry and tonsillar size of about 2-3+ bilaterally.  Tongue protrudes centrally and palate elevates symmetrically, Mallampati class II.  Neck circumference 14-3/8 inches.  Minimal overbite noted.  Chest: Clear to auscultation without wheezing, rhonchi or crackles noted.  Heart: S1+S2+0, regular and normal without murmurs, rubs or gallops noted.   Abdomen: Soft,  non-tender and non-distended.  Extremities: There is no pitting edema in the distal lower extremities bilaterally.  Wears compression stockings.  Skin: Warm and dry without trophic changes noted.   Musculoskeletal: exam reveals no obvious joint deformities.   Neurologically:  Mental status: The patient is awake, alert and oriented in all 4 spheres. Her immediate and remote memory, attention, language skills and fund of knowledge are appropriate. There is no evidence of aphasia, agnosia, apraxia or anomia. Speech is clear with normal prosody and enunciation. Thought process is linear. Mood is normal and affect is normal.  Cranial nerves II - XII are as described above under HEENT exam.  Motor exam: Normal bulk, strength and tone is noted. There is no obvious action or resting tremor.  Fine motor skills and coordination: grossly intact.  Cerebellar testing: No dysmetria or intention tremor. There is no truncal or gait ataxia.  Sensory exam: intact to light touch in the upper and lower extremities.  Gait, station and balance: She stands easily. No veering to one side is noted. No leaning to one side is noted. Posture is age-appropriate and stance is narrow based. Gait shows normal stride length and normal pace. No problems turning are noted.    Assessment and Plan:  In summary, Cheryl Mckinney is a very pleasant 58 year old female with an underlying medical history of hypertension, and hypertensive urgency, smoking, history of left leg ulcer, DVT, and overweight state, whose history and physical exam are concerning for sleep disordered breathing, particularly obstructive sleep apnea (OSA) versus upper airway resistance syndrome (UARS) versus central sleep apnea (CSA), or mixed sleep apnea. A laboratory attended sleep study is typically considered "gold standard" for evaluation of sleep disordered breathing.   I had a long chat with the patient about my findings and the diagnosis of sleep apnea, particularly OSA, its prognosis and treatment options. We talked about medical/conservative treatments, surgical interventions and non-pharmacological approaches for symptom control. I explained, in particular, the risks and ramifications of untreated moderate to severe OSA, especially with respect to developing cardiovascular disease down the road, including congestive heart failure (CHF), difficult to treat hypertension, cardiac arrhythmias (particularly A-fib), neurovascular complications including TIA, stroke and dementia. Even type 2 diabetes has, in part, been linked to untreated OSA. Symptoms of untreated OSA may include (but may not be limited to) daytime sleepiness, nocturia (i.e. frequent nighttime urination), memory problems, mood irritability and suboptimally controlled or worsening mood disorder such as depression and/or anxiety, lack of energy, lack of motivation, physical discomfort, as well as recurrent headaches, especially morning or nocturnal headaches. We talked about the importance of maintaining a healthy lifestyle and striving for healthy weight.  The importance of complete smoking cessation was also addressed.  In addition, we talked about the importance of striving for and maintaining good sleep hygiene. I recommended a sleep study at  this time. I outlined the differences between a laboratory attended sleep study which is considered more comprehensive and accurate over the option of a home sleep test (HST); the latter may lead to underestimation of sleep disordered breathing in some instances and does not help with diagnosing upper airway resistance syndrome and is not accurate enough to diagnose primary central sleep apnea typically. I outlined possible surgical and non-surgical treatment options of OSA, including the use of a positive airway pressure (PAP) device (i.e. CPAP, AutoPAP/APAP or BiPAP in certain circumstances), a custom-made dental device (aka oral appliance, which would require a referral to a specialist dentist or orthodontist typically, and is  generally speaking not considered for patients with full dentures or edentulous state), upper airway surgical options, such as traditional UPPP (which is not considered a first-line treatment) or the Inspire device (hypoglossal nerve stimulator, which would involve a referral for consultation with an ENT surgeon, after careful selection, following inclusion criteria - also not first-line treatment). I explained the PAP treatment option to the patient in detail, as this is generally considered first-line treatment.  The patient indicated that she would be willing to try PAP therapy, if the need arises. I explained the importance of being compliant with PAP treatment, not only for insurance purposes but primarily to improve patient's symptoms symptoms, and for the patient's long term health benefit, including to reduce Her cardiovascular risks longer-term.    We will pick up our discussion about the next steps and treatment options after testing.  We will keep her posted as to the test results by phone call and/or MyChart messaging where possible.  We will plan to follow-up in sleep clinic accordingly as well.  I answered all her questions today and the patient was in agreement.   I  encouraged her to call with any interim questions, concerns, problems or updates or email [place] through MyChart.  Generally speaking, sleep test authorizations may take up to 2 weeks, sometimes less, sometimes longer, the patient is encouraged to get in touch with [place] if they do not hear back from the sleep lab staff directly within the next 2 weeks.  Thank you very much for allowing me to participate in the care of this nice patient. If I can be of any further assistance to you please do not hesitate to call me at [phone].  Sincerely,   Cheryl Foley, MD, PhD

## 2024-01-20 ENCOUNTER — Telehealth: Payer: Self-pay | Admitting: Neurology

## 2024-01-20 NOTE — Telephone Encounter (Signed)
 [facility] pending

## 2024-01-27 ENCOUNTER — Ambulatory Visit: Payer: Medicaid Other

## 2024-01-28 NOTE — Telephone Encounter (Signed)
 [facility]PSG [facility]k[place] Harvey: [id number] (exp. 01/20/24 to 02/16/24)

## 2024-02-02 NOTE — Telephone Encounter (Signed)
 Left voicemail for patient to call back to schedule

## 2024-02-05 ENCOUNTER — Ambulatory Visit
Admission: RE | Admit: 2024-02-05 | Discharge: 2024-02-05 | Disposition: A | Discharge status: Home / Self Care | Source: Ambulatory Visit | Attending: Family Medicine | Admitting: Family Medicine

## 2024-02-05 DIAGNOSIS — Z0000 Encounter for general adult medical examination without abnormal findings: Secondary | ICD-10-CM

## 2024-02-10 ENCOUNTER — Other Ambulatory Visit: Payer: Self-pay | Admitting: Family Medicine

## 2024-02-10 DIAGNOSIS — R928 Other abnormal and inconclusive findings on diagnostic imaging of breast: Secondary | ICD-10-CM

## 2024-02-12 NOTE — Telephone Encounter (Signed)
 X2 LVM for pt to call back to schedule to schedule SS

## 2024-02-23 ENCOUNTER — Other Ambulatory Visit

## 2024-02-23 NOTE — Telephone Encounter (Signed)
 Patient called back wanting to schedule the SS.  [facility] pending new auth.

## 2024-02-26 NOTE — Telephone Encounter (Signed)
Checked the status it is still pending  

## 2024-03-01 NOTE — Telephone Encounter (Signed)
 Spoke with the patient.  [facility]PS[facility] Dress: [id number] (exp. 02/23/24 to 05/17/24)   She is scheduled at [facility] for 04/18/24 at 9 pm.  Sent mychart and mailed packet.

## 2024-03-01 NOTE — Telephone Encounter (Signed)
 NPSG M[facility] Dress: [id number] (exp. 02/23/24 to 05/17/24)

## 2024-03-02 ENCOUNTER — Other Ambulatory Visit

## 2024-04-18 ENCOUNTER — Encounter
